# Patient Record
Sex: Female | Born: 1955 | Race: Black or African American | Hispanic: No | Smoking: Never smoker
Health system: Southern US, Community
[De-identification: ages and names within clinical notes are randomized; demographics above are authoritative.]

---

## 1999-12-11 ENCOUNTER — Emergency Department (HOSPITAL_COMMUNITY): Admission: EM | Admit: 1999-12-11 | Discharge: 1999-12-11 | Payer: Self-pay | Admitting: Emergency Medicine

## 2000-10-05 ENCOUNTER — Emergency Department (HOSPITAL_COMMUNITY): Admission: EM | Admit: 2000-10-05 | Discharge: 2000-10-05 | Payer: Self-pay | Admitting: Emergency Medicine

## 2000-10-08 ENCOUNTER — Emergency Department (HOSPITAL_COMMUNITY): Admission: EM | Admit: 2000-10-08 | Discharge: 2000-10-08 | Payer: Self-pay | Admitting: Emergency Medicine

## 2002-09-04 ENCOUNTER — Ambulatory Visit (HOSPITAL_COMMUNITY): Admission: RE | Admit: 2002-09-04 | Discharge: 2002-09-04 | Payer: Self-pay | Admitting: Obstetrics

## 2002-09-04 ENCOUNTER — Encounter: Payer: Self-pay | Admitting: Obstetrics

## 2002-10-15 ENCOUNTER — Inpatient Hospital Stay (HOSPITAL_COMMUNITY): Admission: AD | Admit: 2002-10-15 | Discharge: 2002-10-18 | Payer: Self-pay | Admitting: Obstetrics

## 2004-04-11 ENCOUNTER — Emergency Department (HOSPITAL_COMMUNITY): Admission: EM | Admit: 2004-04-11 | Discharge: 2004-04-11 | Payer: Self-pay | Admitting: Family Medicine

## 2004-04-12 ENCOUNTER — Emergency Department (HOSPITAL_COMMUNITY): Admission: EM | Admit: 2004-04-12 | Discharge: 2004-04-12 | Payer: Self-pay | Admitting: Emergency Medicine

## 2004-04-18 ENCOUNTER — Emergency Department (HOSPITAL_COMMUNITY): Admission: EM | Admit: 2004-04-18 | Discharge: 2004-04-18 | Payer: Self-pay | Admitting: Family Medicine

## 2004-04-20 ENCOUNTER — Emergency Department (HOSPITAL_COMMUNITY): Admission: EM | Admit: 2004-04-20 | Discharge: 2004-04-20 | Payer: Self-pay | Admitting: Emergency Medicine

## 2004-07-26 ENCOUNTER — Emergency Department: Payer: Self-pay | Admitting: General Practice

## 2004-11-12 ENCOUNTER — Emergency Department: Payer: Self-pay | Admitting: Emergency Medicine

## 2004-11-17 ENCOUNTER — Emergency Department: Payer: Self-pay | Admitting: Emergency Medicine

## 2004-11-17 ENCOUNTER — Other Ambulatory Visit: Payer: Self-pay

## 2005-01-06 ENCOUNTER — Emergency Department: Payer: Self-pay | Admitting: Emergency Medicine

## 2005-03-13 ENCOUNTER — Emergency Department: Payer: Self-pay | Admitting: Emergency Medicine

## 2005-04-14 ENCOUNTER — Emergency Department: Payer: Self-pay | Admitting: Emergency Medicine

## 2005-04-19 ENCOUNTER — Emergency Department: Payer: Self-pay | Admitting: Emergency Medicine

## 2005-04-26 ENCOUNTER — Emergency Department: Payer: Self-pay | Admitting: Emergency Medicine

## 2005-04-29 ENCOUNTER — Emergency Department: Payer: Self-pay | Admitting: Emergency Medicine

## 2005-06-10 ENCOUNTER — Emergency Department: Payer: Self-pay | Admitting: Emergency Medicine

## 2005-06-14 ENCOUNTER — Emergency Department: Payer: Self-pay | Admitting: Emergency Medicine

## 2005-09-24 ENCOUNTER — Emergency Department: Payer: Self-pay | Admitting: Unknown Physician Specialty

## 2005-09-25 ENCOUNTER — Emergency Department: Payer: Self-pay | Admitting: Emergency Medicine

## 2005-10-03 ENCOUNTER — Emergency Department: Payer: Self-pay | Admitting: Emergency Medicine

## 2005-10-12 ENCOUNTER — Emergency Department (HOSPITAL_COMMUNITY): Admission: EM | Admit: 2005-10-12 | Discharge: 2005-10-12 | Payer: Self-pay | Admitting: Emergency Medicine

## 2005-10-31 ENCOUNTER — Emergency Department: Payer: Self-pay | Admitting: Emergency Medicine

## 2005-12-06 ENCOUNTER — Emergency Department: Payer: Self-pay | Admitting: General Practice

## 2006-02-26 ENCOUNTER — Emergency Department: Payer: Self-pay | Admitting: Unknown Physician Specialty

## 2006-03-02 ENCOUNTER — Emergency Department: Payer: Self-pay | Admitting: Emergency Medicine

## 2006-04-07 ENCOUNTER — Inpatient Hospital Stay: Payer: Self-pay

## 2006-04-08 ENCOUNTER — Other Ambulatory Visit: Payer: Self-pay

## 2006-05-04 ENCOUNTER — Emergency Department: Payer: Self-pay | Admitting: Internal Medicine

## 2006-05-09 ENCOUNTER — Inpatient Hospital Stay: Payer: Self-pay

## 2006-05-27 ENCOUNTER — Other Ambulatory Visit: Payer: Self-pay

## 2006-05-27 ENCOUNTER — Emergency Department: Payer: Self-pay | Admitting: Emergency Medicine

## 2006-07-12 ENCOUNTER — Emergency Department: Payer: Self-pay | Admitting: Emergency Medicine

## 2006-08-12 ENCOUNTER — Emergency Department: Payer: Self-pay

## 2007-04-21 ENCOUNTER — Emergency Department: Payer: Self-pay | Admitting: Emergency Medicine

## 2007-10-31 ENCOUNTER — Emergency Department: Payer: Self-pay | Admitting: Emergency Medicine

## 2007-12-11 ENCOUNTER — Emergency Department: Payer: Self-pay | Admitting: Emergency Medicine

## 2007-12-23 ENCOUNTER — Emergency Department: Payer: Self-pay | Admitting: Emergency Medicine

## 2008-03-08 ENCOUNTER — Emergency Department: Payer: Self-pay | Admitting: Emergency Medicine

## 2008-05-01 ENCOUNTER — Emergency Department: Payer: Self-pay | Admitting: Unknown Physician Specialty

## 2008-05-02 IMAGING — CR DG ABDOMEN ACUTE W/ 1V CHEST
3 series · 3 of 3 positions shown · non-contrast
Comparison: none

CLINICAL DATA: Abdominal pain, left-sided.
 ACUTE ABDOMINAL SERIES:

[view not recorded (1 of 3)]
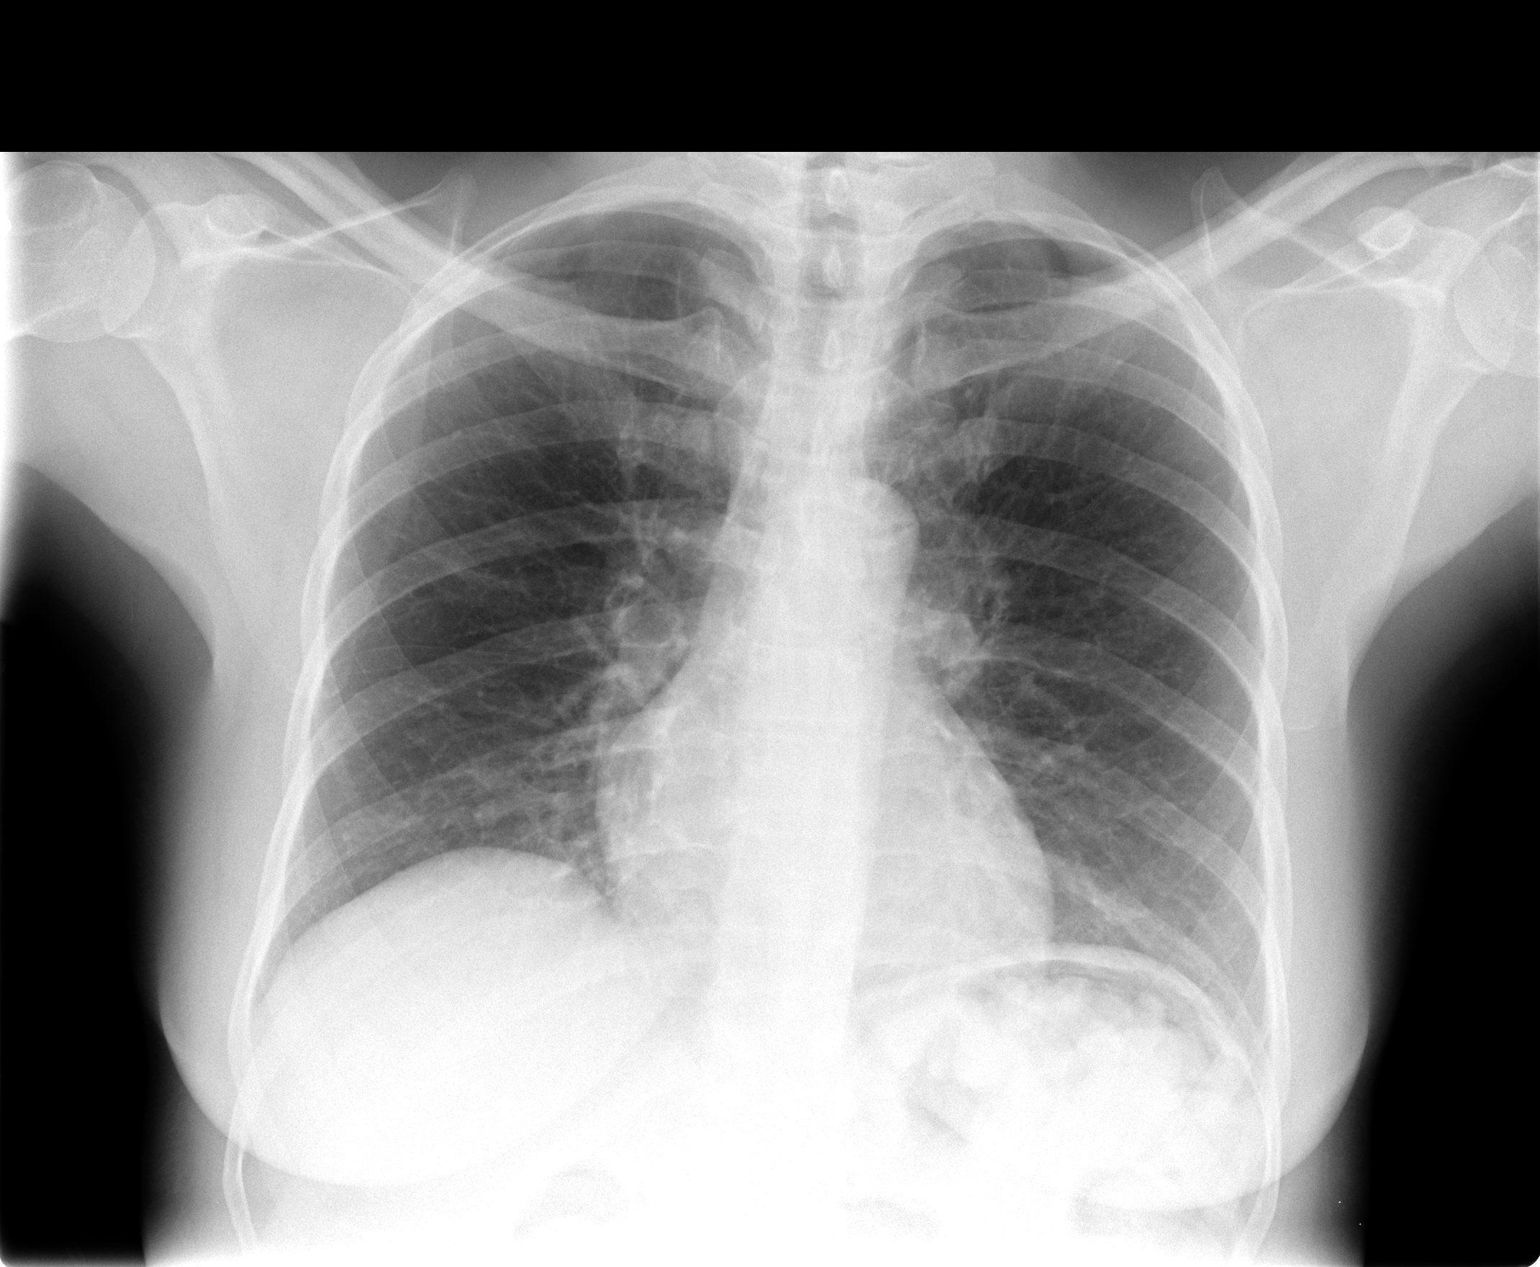

[view not recorded (2 of 3)]
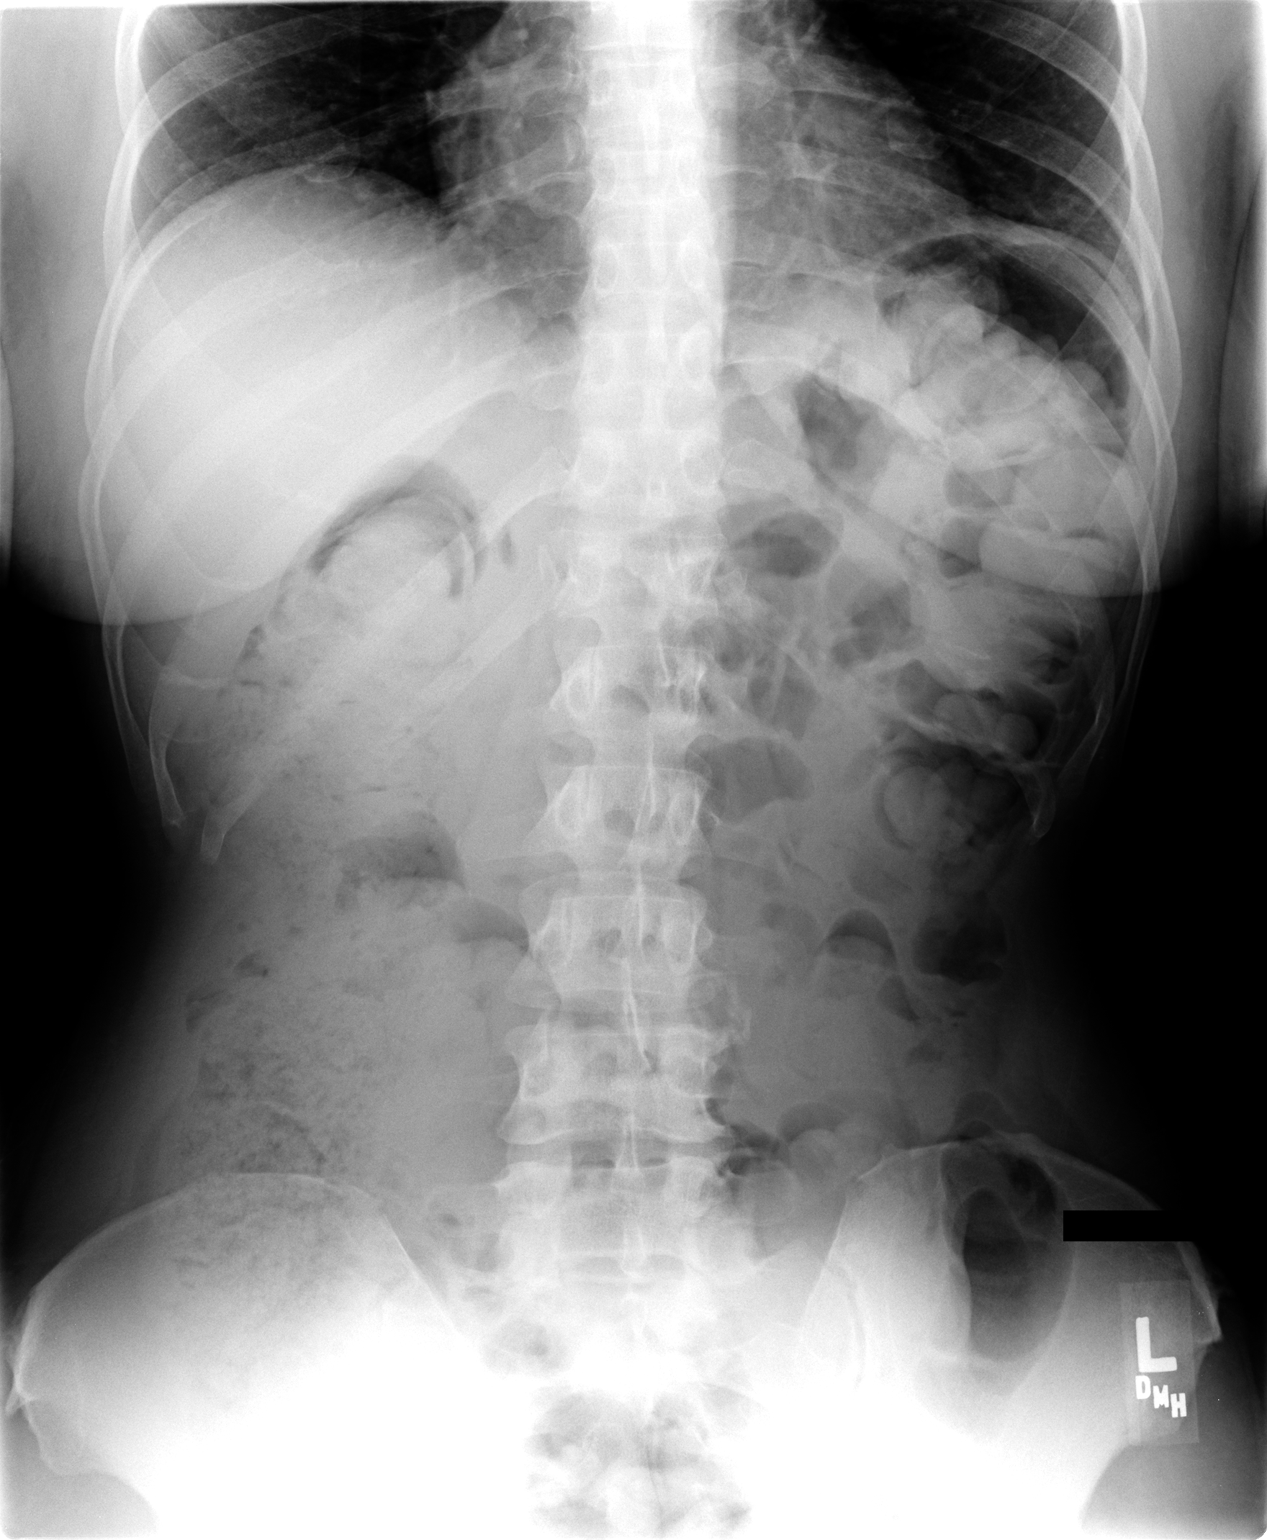

[view not recorded (3 of 3)]
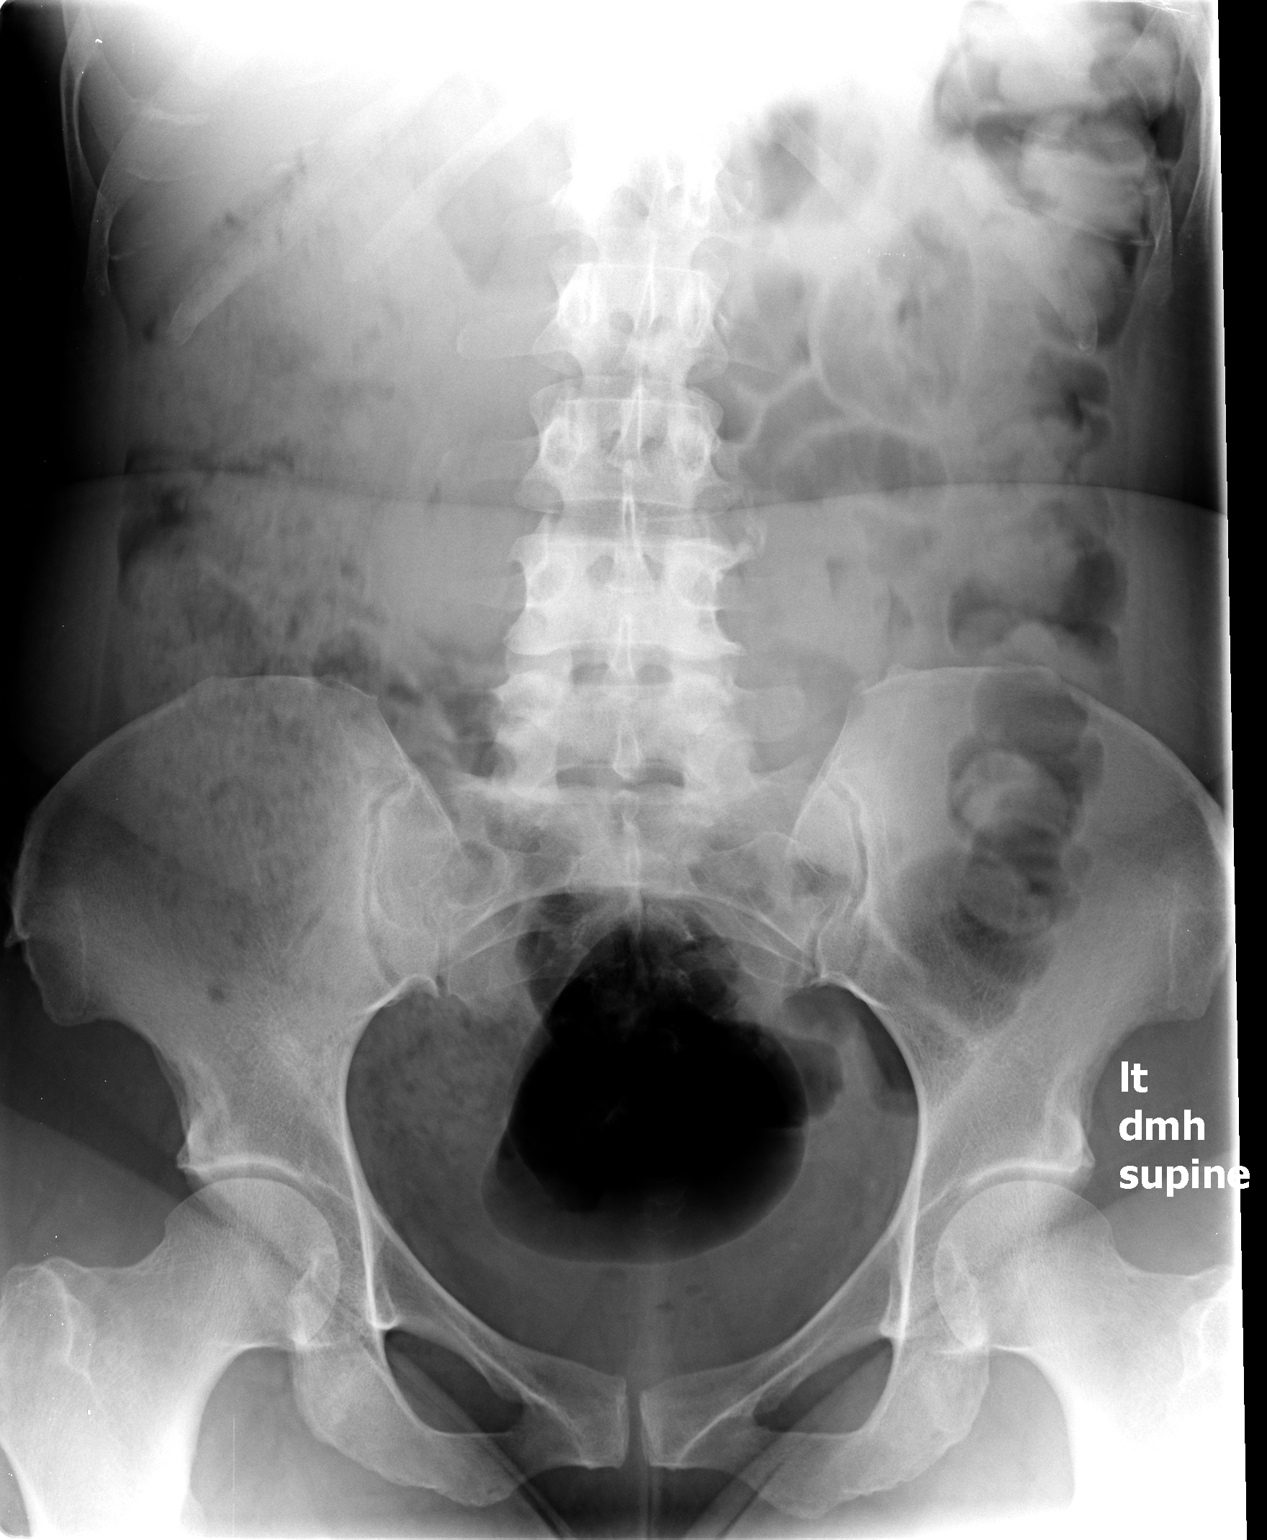

[3 of 3 positions shown; findings below may reference images not displayed]

FINDINGS: Chest x-ray shows no acute abnormality.  The heart size is normal.  There may be some mild chronic lung disease with increased markings bilaterally. 
 The patient is constipated with retained stool especially in the right colon, but also in the left colon.  There is no obstruction or free air.  No acute bony abnormality.
IMPRESSION: Constipation without obstruction.

## 2008-05-02 IMAGING — CT CT HEAD W/O CM
2 of 3 series · 15 of 30 positions shown, 17 images · IV contrast (agent unspecified)
Comparison: None.

CLINICAL DATA: Headache. 
 HEAD CT WITHOUT CONTRAST:
TECHNIQUE: Contiguous axial images were obtained from the base of the skull through the vertex according to standard protocol without contrast.

[Series 2: head-trauma 4.8 h45s st · axial · 0.49mm/px · z∈[-125,+3]mm · 7 of 36 slices shown, 9 images]
[im 5/36  brain]
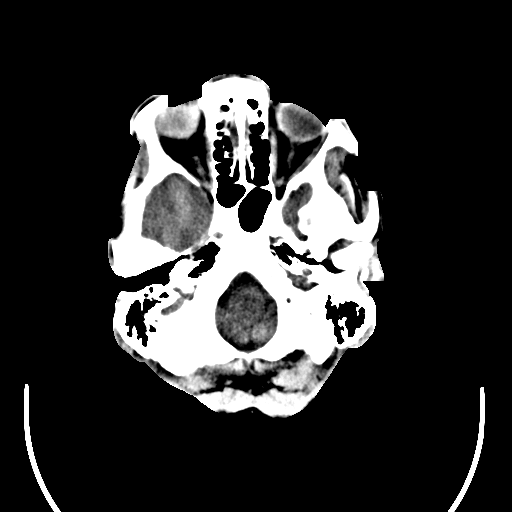
[im 5/36  bone]
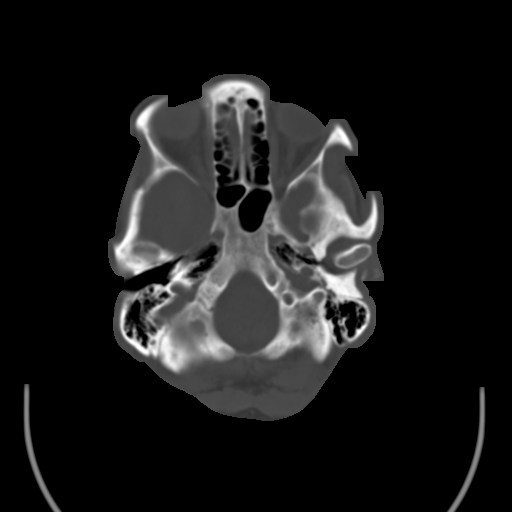
[im 9/36  brain]
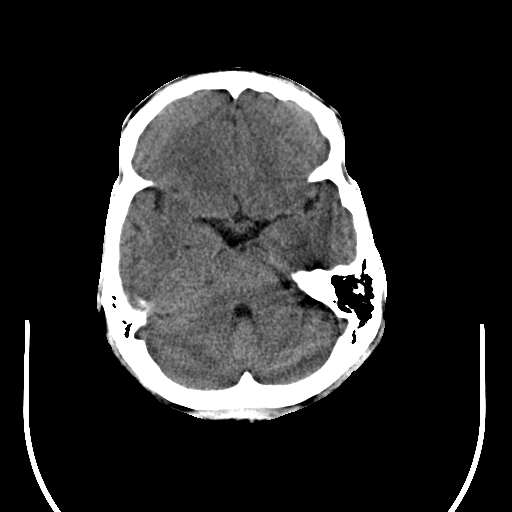
[im 14/36  brain]
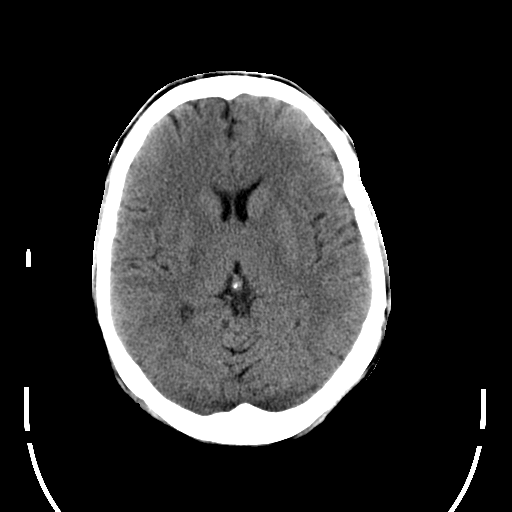
[im 18/36  brain]
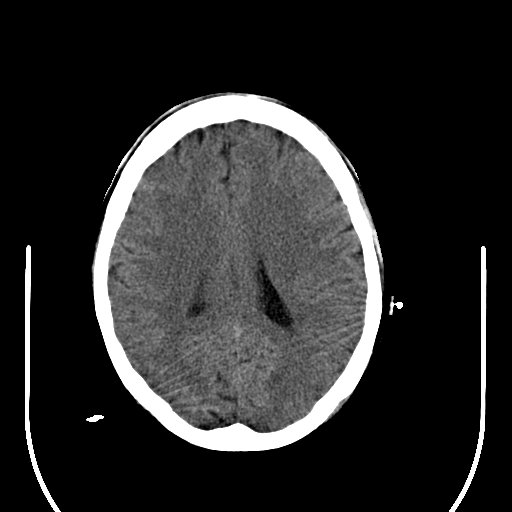
[im 22/36  brain]
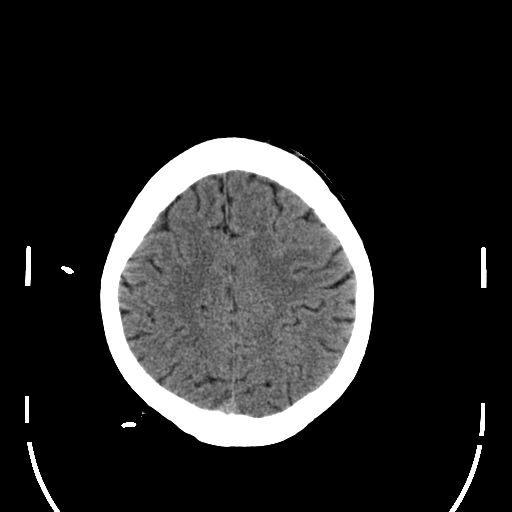
[im 22/36  bone]
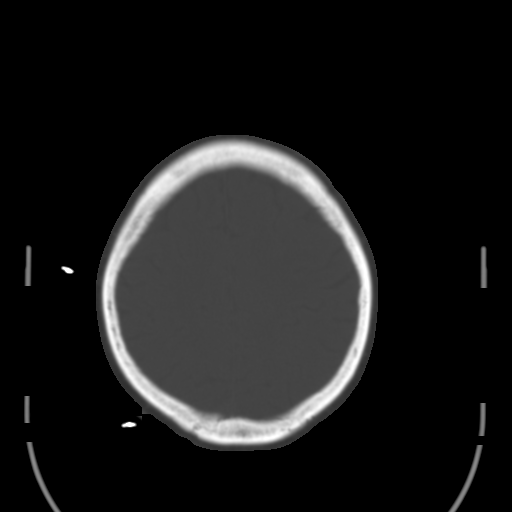
[im 27/36  brain]
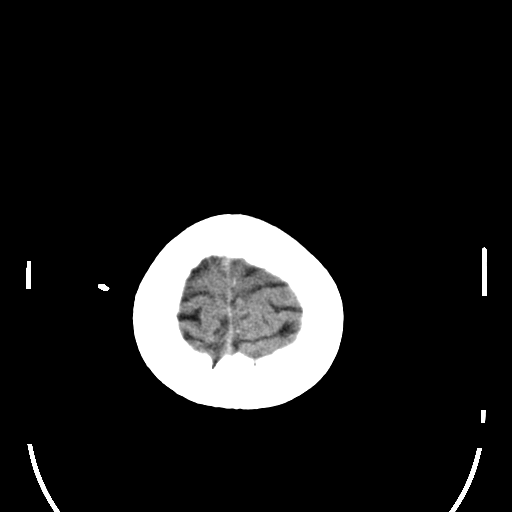
[im 31/36  brain]
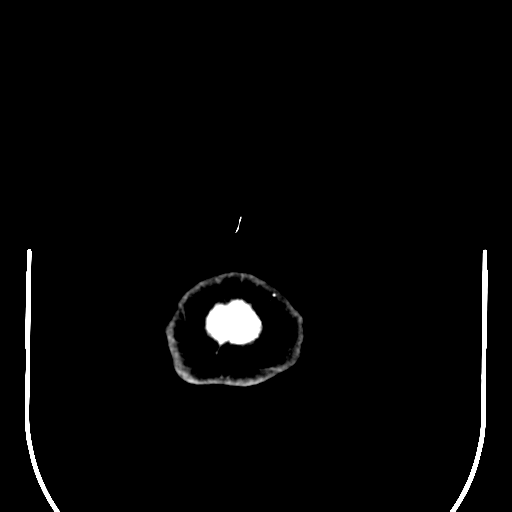

[Series 4: head-trauma 2.4 h60s bone · axial · 0.49mm/px · z∈[-126,+16]mm · 8 of 72 slices shown]
[im 9/72  bone]
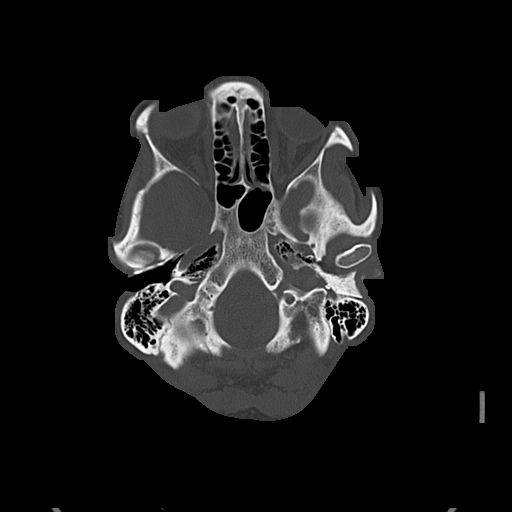
[im 17/72  bone]
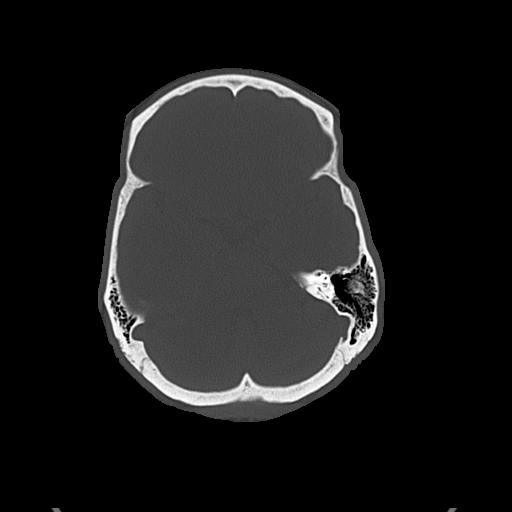
[im 26/72  bone]
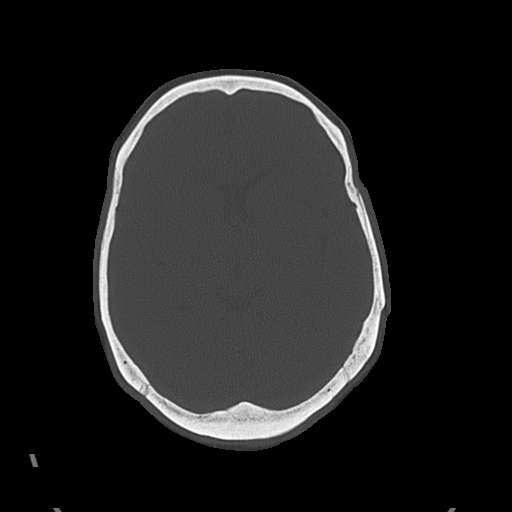
[im 34/72  bone]
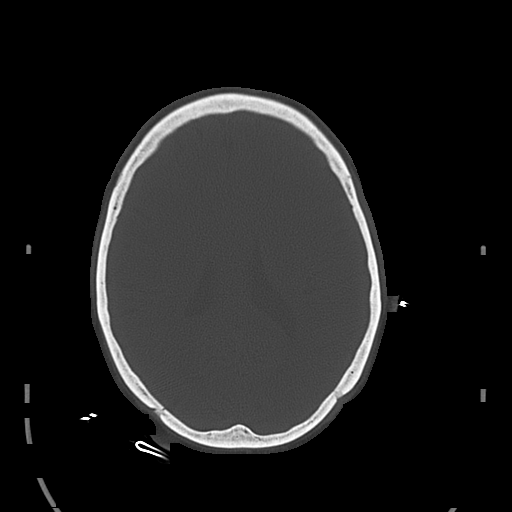
[im 42/72  bone]
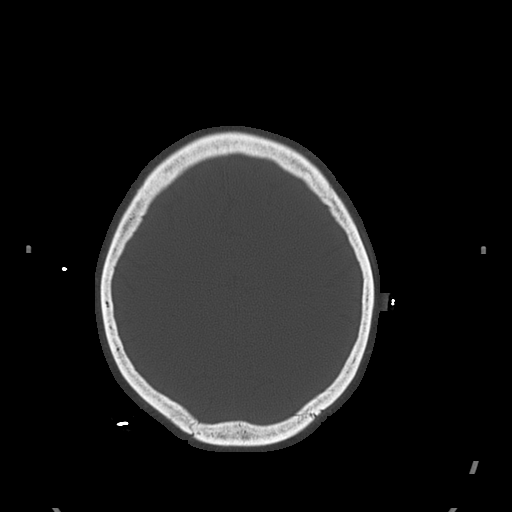
[im 51/72  bone]
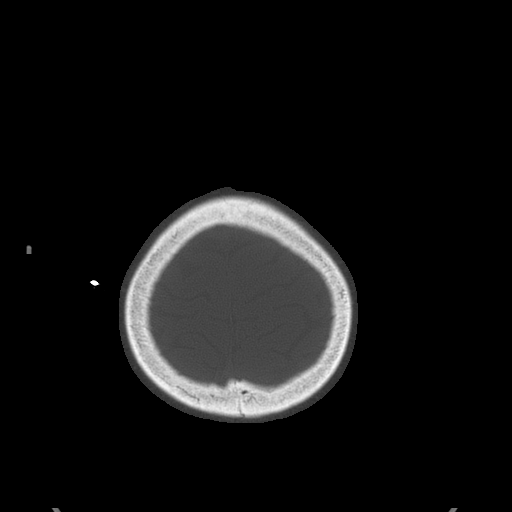
[im 59/72  bone]
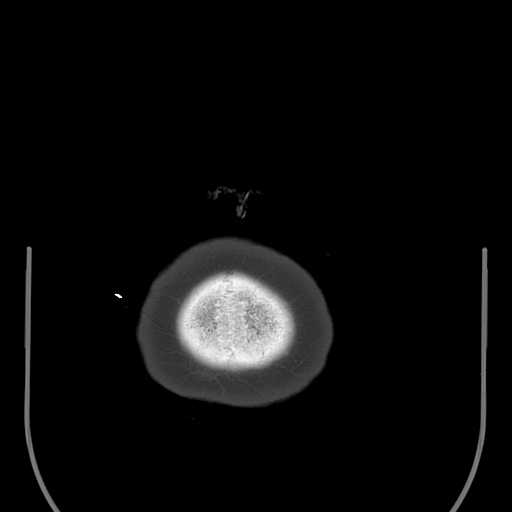
[im 67/72  bone]
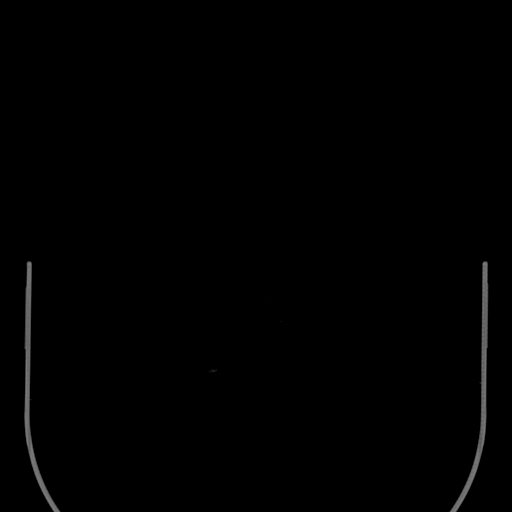

[15 of 30 positions shown; findings below may reference images not displayed]

FINDINGS: There is no evidence of intracranial hemorrhage, brain edema, acute infarct, mass lesion, or mass effect.  No other intra-axial abnormalities are seen, and the ventricles are within normal limits.  No abnormal extra-axial fluid collections or masses are identified.  No skull abnormalities are noted.
IMPRESSION: Negative non-contrast head CT.

## 2008-05-23 ENCOUNTER — Emergency Department: Payer: Self-pay | Admitting: Emergency Medicine

## 2008-06-28 ENCOUNTER — Inpatient Hospital Stay: Payer: Self-pay | Admitting: Specialist

## 2008-07-29 ENCOUNTER — Inpatient Hospital Stay: Payer: Self-pay | Admitting: Psychiatry

## 2008-08-09 ENCOUNTER — Emergency Department: Payer: Self-pay | Admitting: Emergency Medicine

## 2008-08-23 ENCOUNTER — Emergency Department: Payer: Self-pay | Admitting: Emergency Medicine

## 2008-12-13 ENCOUNTER — Ambulatory Visit: Payer: Self-pay | Admitting: Gastroenterology

## 2014-06-22 ENCOUNTER — Other Ambulatory Visit: Payer: Self-pay | Admitting: Internal Medicine

## 2014-06-22 DIAGNOSIS — Z1231 Encounter for screening mammogram for malignant neoplasm of breast: Secondary | ICD-10-CM

## 2014-07-01 ENCOUNTER — Ambulatory Visit
Admission: RE | Admit: 2014-07-01 | Discharge: 2014-07-01 | Disposition: A | Discharge status: Home / Self Care | Payer: Medicare Other | Source: Ambulatory Visit | Attending: Internal Medicine | Admitting: Internal Medicine

## 2014-07-01 DIAGNOSIS — Z1231 Encounter for screening mammogram for malignant neoplasm of breast: Secondary | ICD-10-CM | POA: Insufficient documentation

## 2014-07-02 ENCOUNTER — Other Ambulatory Visit: Payer: Self-pay | Admitting: Internal Medicine

## 2014-07-02 DIAGNOSIS — R928 Other abnormal and inconclusive findings on diagnostic imaging of breast: Secondary | ICD-10-CM

## 2014-07-12 ENCOUNTER — Ambulatory Visit
Admission: RE | Admit: 2014-07-12 | Discharge: 2014-07-12 | Disposition: A | Discharge status: Home / Self Care | Payer: Medicare Other | Source: Ambulatory Visit | Attending: Internal Medicine | Admitting: Internal Medicine

## 2014-07-12 DIAGNOSIS — R928 Other abnormal and inconclusive findings on diagnostic imaging of breast: Secondary | ICD-10-CM

## 2014-07-12 DIAGNOSIS — N6002 Solitary cyst of left breast: Secondary | ICD-10-CM | POA: Diagnosis not present

## 2014-07-12 DIAGNOSIS — N6489 Other specified disorders of breast: Secondary | ICD-10-CM | POA: Diagnosis present

## 2015-11-10 ENCOUNTER — Other Ambulatory Visit: Payer: Self-pay | Admitting: Family Medicine

## 2015-11-10 DIAGNOSIS — Z1231 Encounter for screening mammogram for malignant neoplasm of breast: Secondary | ICD-10-CM

## 2015-12-19 ENCOUNTER — Ambulatory Visit: Payer: Medicare Other | Attending: Family Medicine

## 2016-01-12 ENCOUNTER — Ambulatory Visit
Admission: RE | Admit: 2016-01-12 | Discharge: 2016-01-12 | Disposition: A | Discharge status: Home / Self Care | Payer: Medicare Other | Source: Ambulatory Visit | Attending: Family Medicine | Admitting: Family Medicine

## 2016-01-12 DIAGNOSIS — Z1231 Encounter for screening mammogram for malignant neoplasm of breast: Secondary | ICD-10-CM

## 2016-01-13 ENCOUNTER — Ambulatory Visit: Payer: Medicare Other

## 2017-10-10 ENCOUNTER — Other Ambulatory Visit: Payer: Self-pay | Admitting: Family Medicine

## 2017-10-10 DIAGNOSIS — Z1231 Encounter for screening mammogram for malignant neoplasm of breast: Secondary | ICD-10-CM

## 2017-10-18 ENCOUNTER — Other Ambulatory Visit: Payer: Self-pay | Admitting: Family Medicine

## 2017-10-18 DIAGNOSIS — N644 Mastodynia: Secondary | ICD-10-CM

## 2017-10-29 ENCOUNTER — Ambulatory Visit
Admission: RE | Admit: 2017-10-29 | Discharge: 2017-10-29 | Disposition: A | Discharge status: Home / Self Care | Payer: Medicare Other | Source: Ambulatory Visit | Attending: Family Medicine | Admitting: Family Medicine

## 2017-10-29 ENCOUNTER — Ambulatory Visit: Payer: Medicare Other

## 2017-10-29 ENCOUNTER — Ambulatory Visit (INDEPENDENT_AMBULATORY_CARE_PROVIDER_SITE_OTHER): Payer: Medicare Other | Admitting: Physician Assistant

## 2017-10-29 DIAGNOSIS — N644 Mastodynia: Secondary | ICD-10-CM

## 2017-11-28 ENCOUNTER — Ambulatory Visit (INDEPENDENT_AMBULATORY_CARE_PROVIDER_SITE_OTHER): Payer: Medicare Other | Admitting: Physician Assistant

## 2019-04-13 ENCOUNTER — Other Ambulatory Visit: Payer: Self-pay | Admitting: Family Medicine

## 2019-04-13 DIAGNOSIS — Z780 Asymptomatic menopausal state: Secondary | ICD-10-CM

## 2019-04-13 DIAGNOSIS — R5381 Other malaise: Secondary | ICD-10-CM

## 2019-04-28 ENCOUNTER — Other Ambulatory Visit: Payer: Self-pay | Admitting: Family Medicine

## 2019-04-28 DIAGNOSIS — E2839 Other primary ovarian failure: Secondary | ICD-10-CM

## 2019-08-21 ENCOUNTER — Other Ambulatory Visit: Payer: Medicare Other

## 2019-12-07 ENCOUNTER — Other Ambulatory Visit (HOSPITAL_COMMUNITY): Payer: Self-pay | Admitting: Student

## 2019-12-07 DIAGNOSIS — N183 Chronic kidney disease, stage 3 unspecified: Secondary | ICD-10-CM

## 2019-12-14 ENCOUNTER — Encounter (HOSPITAL_COMMUNITY): Payer: Self-pay | Admitting: Student

## 2019-12-28 ENCOUNTER — Encounter (HOSPITAL_COMMUNITY): Payer: Self-pay | Admitting: Student

## 2019-12-29 ENCOUNTER — Other Ambulatory Visit: Payer: Self-pay | Admitting: Student

## 2019-12-29 DIAGNOSIS — Z1231 Encounter for screening mammogram for malignant neoplasm of breast: Secondary | ICD-10-CM

## 2020-01-07 ENCOUNTER — Telehealth (HOSPITAL_COMMUNITY): Payer: Self-pay | Admitting: Student

## 2020-01-07 NOTE — Telephone Encounter (Signed)
Just an FYI. We have made several attempts to contact this patient including sending a letter to schedule or reschedule their echocardiogram. We will be removing the patient from the echo WQ.  12/28/19 MAILED LETTER [facility]  12/24/19 NVM set up and unable to lm @ 10:38/[facility] 12/14/19 Called and went straight to VM and not set up - MAILED LETTER/[facility] 12/07/19 called and NVM set up @ 3:09/[facility]     Thank you

## 2020-07-05 ENCOUNTER — Emergency Department (HOSPITAL_COMMUNITY)
Admission: EM | Admit: 2020-07-05 | Discharge: 2020-07-05 | Disposition: A | Discharge status: Home / Self Care | Payer: Medicare Other | Attending: Emergency Medicine | Admitting: Emergency Medicine

## 2020-07-05 ENCOUNTER — Emergency Department (HOSPITAL_COMMUNITY): Payer: Medicare Other

## 2020-07-05 ENCOUNTER — Encounter (HOSPITAL_COMMUNITY): Payer: Self-pay

## 2020-07-05 ENCOUNTER — Other Ambulatory Visit: Payer: Self-pay

## 2020-07-05 DIAGNOSIS — R519 Headache, unspecified: Secondary | ICD-10-CM | POA: Insufficient documentation

## 2020-07-05 DIAGNOSIS — I10 Essential (primary) hypertension: Secondary | ICD-10-CM | POA: Diagnosis not present

## 2020-07-05 DIAGNOSIS — H9201 Otalgia, right ear: Secondary | ICD-10-CM | POA: Insufficient documentation

## 2020-07-05 LAB — COMPREHENSIVE METABOLIC PANEL
ALT: 19 U/L (ref 0–44)
AST: 29 U/L (ref 15–41)
Albumin: 3.5 g/dL (ref 3.5–5.0)
Alkaline Phosphatase: 63 U/L (ref 38–126)
Anion gap: 6 (ref 5–15)
BUN: 18 mg/dL (ref 8–23)
CO2: 28 mmol/L (ref 22–32)
Calcium: 10.2 mg/dL (ref 8.9–10.3)
Chloride: 102 mmol/L (ref 98–111)
Creatinine, Ser: 1.16 mg/dL — ABNORMAL HIGH (ref 0.44–1.00)
GFR, Estimated: 52 mL/min — ABNORMAL LOW (ref >60)
Glucose, Bld: 104 mg/dL — ABNORMAL HIGH (ref 70–99)
Potassium: 4 mmol/L (ref 3.5–5.1)
Sodium: 136 mmol/L (ref 135–145)
Total Bilirubin: 0.5 mg/dL (ref 0.3–1.2)
Total Protein: 7.1 g/dL (ref 6.5–8.1)

## 2020-07-05 LAB — RAPID URINE DRUG SCREEN, HOSP PERFORMED
Amphetamines: NOT DETECTED
Barbiturates: NOT DETECTED
Benzodiazepines: NOT DETECTED
Cocaine: NOT DETECTED
Opiates: NOT DETECTED
Tetrahydrocannabinol: NOT DETECTED

## 2020-07-05 LAB — CBC
HCT: 38.6 % (ref 36.0–46.0)
Hemoglobin: 12.1 g/dL (ref 12.0–15.0)
MCH: 27.3 pg (ref 26.0–34.0)
MCHC: 31.3 g/dL (ref 30.0–36.0)
MCV: 86.9 fL (ref 80.0–100.0)
Platelets: 194 10*3/uL (ref 150–400)
RBC: 4.44 MIL/uL (ref 3.87–5.11)
RDW: 14.7 % (ref 11.5–15.5)
WBC: 8.5 10*3/uL (ref 4.0–10.5)
nRBC: 0 % (ref 0.0–0.2)

## 2020-07-05 LAB — SALICYLATE LEVEL: Salicylate Lvl: <7 mg/dL — ABNORMAL LOW (ref 7.0–30.0)

## 2020-07-05 LAB — ACETAMINOPHEN LEVEL: Acetaminophen (Tylenol), Serum: <10 ug/mL — ABNORMAL LOW (ref 10–30)

## 2020-07-05 LAB — ETHANOL: Alcohol, Ethyl (B): <10 mg/dL (ref <10)

## 2020-07-05 MED ORDER — ACETAMINOPHEN 500 MG PO TABS
1000.0000 mg | ORAL_TABLET | Freq: Once | ORAL | Status: AC
  Administered 2020-07-05: 1000 mg via ORAL
  Filled 2020-07-05: qty 2

## 2020-07-05 MED ORDER — PALIPERIDONE ER 6 MG PO TB24
6.0000 mg | ORAL_TABLET | Freq: Every day | ORAL | Status: DC
  Administered 2020-07-05: 6 mg via ORAL
  Filled 2020-07-05 (×2): qty 1

## 2020-07-05 MED ORDER — AMLODIPINE BESYLATE 5 MG PO TABS
2.5000 mg | ORAL_TABLET | Freq: Every day | ORAL | Status: DC
  Administered 2020-07-05: 2.5 mg via ORAL
  Filled 2020-07-05: qty 1
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
## 2020-07-05 NOTE — Discharge Instructions (Addendum)
Please read the attached information about resources for counseling/therapy in the area.  I believe this to be helpful.  Please follow-up with your primary care provider this week.  You may always return to the ER for reevaluation for any new or concerning symptoms.  Please take your blood pressure medication, your Invega/paliperidone as prescribed every day.
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
## 2020-07-26 ENCOUNTER — Emergency Department (HOSPITAL_COMMUNITY)
Admission: EM | Admit: 2020-07-26 | Discharge: 2020-07-27 | Disposition: A | Discharge status: Other Acute Inpatient Hospital | Payer: Medicare Other | Attending: Emergency Medicine | Admitting: Emergency Medicine

## 2020-07-26 ENCOUNTER — Other Ambulatory Visit: Payer: Self-pay

## 2020-07-26 DIAGNOSIS — F23 Brief psychotic disorder: Secondary | ICD-10-CM | POA: Diagnosis not present

## 2020-07-26 DIAGNOSIS — Z79899 Other long term (current) drug therapy: Secondary | ICD-10-CM | POA: Diagnosis not present

## 2020-07-26 DIAGNOSIS — Y900 Blood alcohol level of less than 20 mg/100 ml: Secondary | ICD-10-CM | POA: Insufficient documentation

## 2020-07-26 DIAGNOSIS — F22 Delusional disorders: Secondary | ICD-10-CM | POA: Diagnosis present

## 2020-07-26 DIAGNOSIS — Z20822 Contact with and (suspected) exposure to covid-19: Secondary | ICD-10-CM | POA: Diagnosis not present

## 2020-07-26 LAB — COMPREHENSIVE METABOLIC PANEL
ALT: 17 U/L (ref 0–44)
AST: 22 U/L (ref 15–41)
Albumin: 3.4 g/dL — ABNORMAL LOW (ref 3.5–5.0)
Alkaline Phosphatase: 73 U/L (ref 38–126)
Anion gap: 8 (ref 5–15)
BUN: 16 mg/dL (ref 8–23)
CO2: 23 mmol/L (ref 22–32)
Calcium: 9.8 mg/dL (ref 8.9–10.3)
Chloride: 106 mmol/L (ref 98–111)
Creatinine, Ser: 1.14 mg/dL — ABNORMAL HIGH (ref 0.44–1.00)
GFR, Estimated: 53 mL/min — ABNORMAL LOW (ref >60)
Glucose, Bld: 94 mg/dL (ref 70–99)
Potassium: 4.1 mmol/L (ref 3.5–5.1)
Sodium: 137 mmol/L (ref 135–145)
Total Bilirubin: 0.9 mg/dL (ref 0.3–1.2)
Total Protein: 7 g/dL (ref 6.5–8.1)

## 2020-07-26 LAB — RESP PANEL BY RT-PCR (FLU A&B, COVID) ARPGX2
Influenza A by PCR: NEGATIVE
Influenza B by PCR: NEGATIVE
SARS Coronavirus 2 by RT PCR: NEGATIVE

## 2020-07-26 LAB — CBC WITH DIFFERENTIAL/PLATELET
Abs Immature Granulocytes: 0.01 10*3/uL (ref 0.00–0.07)
Basophils Absolute: 0 10*3/uL (ref 0.0–0.1)
Basophils Relative: 0 %
Eosinophils Absolute: 0.3 10*3/uL (ref 0.0–0.5)
Eosinophils Relative: 4 %
HCT: 40 % (ref 36.0–46.0)
Hemoglobin: 12.8 g/dL (ref 12.0–15.0)
Immature Granulocytes: 0 %
Lymphocytes Relative: 38 %
Lymphs Abs: 3.4 10*3/uL (ref 0.7–4.0)
MCH: 27.4 pg (ref 26.0–34.0)
MCHC: 32 g/dL (ref 30.0–36.0)
MCV: 85.7 fL (ref 80.0–100.0)
Monocytes Absolute: 0.4 10*3/uL (ref 0.1–1.0)
Monocytes Relative: 5 %
Neutro Abs: 4.8 10*3/uL (ref 1.7–7.7)
Neutrophils Relative %: 53 %
Platelets: 185 10*3/uL (ref 150–400)
RBC: 4.67 MIL/uL (ref 3.87–5.11)
RDW: 14.7 % (ref 11.5–15.5)
WBC: 9 10*3/uL (ref 4.0–10.5)
nRBC: 0 % (ref 0.0–0.2)

## 2020-07-26 LAB — RAPID URINE DRUG SCREEN, HOSP PERFORMED
Amphetamines: NOT DETECTED
Barbiturates: NOT DETECTED
Benzodiazepines: NOT DETECTED
Cocaine: NOT DETECTED
Opiates: NOT DETECTED
Tetrahydrocannabinol: NOT DETECTED

## 2020-07-26 LAB — ACETAMINOPHEN LEVEL: Acetaminophen (Tylenol), Serum: <10 ug/mL — ABNORMAL LOW (ref 10–30)

## 2020-07-26 LAB — SALICYLATE LEVEL: Salicylate Lvl: <7 mg/dL — ABNORMAL LOW (ref 7.0–30.0)

## 2020-07-26 LAB — ETHANOL: Alcohol, Ethyl (B): <10 mg/dL (ref <10)

## 2020-07-26 MED ORDER — NORTRIPTYLINE HCL 25 MG PO CAPS
25.0000 mg | ORAL_CAPSULE | Freq: Every day | ORAL | Status: DC
  Administered 2020-07-26: 25 mg via ORAL
  Filled 2020-07-26: qty 1
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
## 2020-07-26 NOTE — ED Notes (Signed)
Pt talking to TTS 
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
## 2020-07-26 NOTE — ED Notes (Signed)
TTS crisis counselor spoke with this RN on the phone and stated that pt is recommended for inpatient. Counselor also told this RN that she plans to call [facility] in regards to this pt after ending our phone call.

## 2020-07-27 NOTE — ED Notes (Signed)
Pt speaking incoherently alone in room, appearing to believe she is speaking with another person.

## 2020-07-27 NOTE — BH Assessment (Addendum)
Disposition: Per Melbourne Abts, PA pt meets inpatient criteria. RN informed of disposition.  [facility] AC notified of disposition and no appropriate bed available at [facility].  CSW to fax out.  Patient has been faxed out to multiple facilities for consideration of be placement. @2330 . Pending review a the following facilities:   [facility]  Pending - Request Sent N/A [address] [place] [phone] --   [facility]  Pending - Request Sent N/A [address] [facility] [place] [phone] --   [facility]  Pending - Request Sent N/A [address] [place] [phone] --   [facility]  Pending - Request Sent N/A [address] [place] [phone] --   [facility]  Pending - Request Sent N/A [address] Heidi [place] [address] [phone] --   [facility]  Pending - Request Sent N/A [address] Rande [place] [phone] --   [facility]  Pending - Request Sent N/A [address] [place] [phone] --   [facility]  Pending - Request Sent N/A [address] [place] [address] [place] [address] [phone] --   [facility]  Pending - Request Sent N/A [address] [facility] [place] [phone] --   [facility]  Pending - Request Sent N/A [address] [place] [phone] --   [facility]  Pending - Request Sent N/A [address], [place] [phone] --   [facility]  Pending - Request Sent N/A [address] [place] [phone] --   [facility]  Pending -  Request Sent N/A [address], [place] [phone] --   [facility]  Pending - Request Sent N/A [address] [place] [phone] --   [facility]  Pending - Request Sent N/A [address], [place] [phone] --   [facility]  Pending - Request Sent N/A [address] [place] [phone] --   [facility]  Pending - Request Sent N/A [address], [place] [phone] --   [facility]  Pending - Request Sent N/A [address], [place] [phone] --   [facility]  Pending - Request Sent N/A [address] [place] [address] [phone] --   [facility]  Pending - Request Sent N/A [address] [place], [place] [phone] --   [facility]  Pending - Request Sent N/A [address] [place] [phone] --   [facility]  Pending - Request Sent N/A [address] [place] [phone] --   [facility]  Pending - Request Sent N/A [address] [place] [address] [phone] --   [facility]  Pending - Request Sent N/A [address] [place], [place] [phone] --   [facility]  Pending - Request Sent N/A [address] [place] [phone] --   [facility]  Pending - Request Sent N/A [address] [place], [place] [phone]  [phone] --   [facility]  Pending - Request Sent N/A [phone] [address] [place]  [address] [phone] --

## 2020-07-27 NOTE — [facility] Notes (Signed)
CSW spoke with ED RN regarding transportation needs. Per the RN, the Pt has been accepted to a facility that is 180 miles away and [facility] only transports patients within a 75 miles distance. CSW contacted the ED SW to assist with securing appropriate transportation.    Damita Dunnings, MSW, LCSW-A  9:12 AM 07/27/2020

## 2020-07-27 NOTE — Progress Notes (Signed)
Pt accepted to [facility]    Patient meets inpatient criteria per Melbourne Abts, PA   Dr. Kevan Ny is the attending provider.    Call report to [phone]   Edwina Barth, RN @ [facility] ED notified.     Pt scheduled  to arrive at [facility] today by 1300.    Damita Dunnings, MSW, LCSW-A  8:44 AM 07/27/2020

## 2020-09-29 ENCOUNTER — Emergency Department (HOSPITAL_COMMUNITY)
Admission: EM | Admit: 2020-09-29 | Discharge: 2020-09-29 | Disposition: A | Discharge status: Home / Self Care | Payer: Medicare Other | Attending: Emergency Medicine | Admitting: Emergency Medicine

## 2020-09-29 ENCOUNTER — Emergency Department (HOSPITAL_COMMUNITY): Payer: Medicare Other

## 2020-09-29 ENCOUNTER — Other Ambulatory Visit: Payer: Self-pay

## 2020-09-29 ENCOUNTER — Encounter (HOSPITAL_COMMUNITY): Payer: Self-pay | Admitting: *Deleted

## 2020-09-29 DIAGNOSIS — R519 Headache, unspecified: Secondary | ICD-10-CM | POA: Diagnosis not present

## 2020-09-29 DIAGNOSIS — M25552 Pain in left hip: Secondary | ICD-10-CM | POA: Diagnosis not present

## 2020-09-29 DIAGNOSIS — W228XXA Striking against or struck by other objects, initial encounter: Secondary | ICD-10-CM | POA: Diagnosis not present

## 2020-09-29 DIAGNOSIS — Z20822 Contact with and (suspected) exposure to covid-19: Secondary | ICD-10-CM | POA: Diagnosis not present

## 2020-09-29 LAB — RESP PANEL BY RT-PCR (FLU A&B, COVID) ARPGX2
Influenza A by PCR: NEGATIVE
Influenza B by PCR: NEGATIVE
SARS Coronavirus 2 by RT PCR: NEGATIVE

## 2020-09-29 LAB — COMPREHENSIVE METABOLIC PANEL
ALT: 18 U/L (ref 0–44)
AST: 25 U/L (ref 15–41)
Albumin: 3.6 g/dL (ref 3.5–5.0)
Alkaline Phosphatase: 73 U/L (ref 38–126)
Anion gap: 10 (ref 5–15)
BUN: 11 mg/dL (ref 8–23)
CO2: 20 mmol/L — ABNORMAL LOW (ref 22–32)
Calcium: 10.1 mg/dL (ref 8.9–10.3)
Chloride: 108 mmol/L (ref 98–111)
Creatinine, Ser: 1.07 mg/dL — ABNORMAL HIGH (ref 0.44–1.00)
GFR, Estimated: 57 mL/min — ABNORMAL LOW (ref >60)
Glucose, Bld: 102 mg/dL — ABNORMAL HIGH (ref 70–99)
Potassium: 4 mmol/L (ref 3.5–5.1)
Sodium: 138 mmol/L (ref 135–145)
Total Bilirubin: 0.6 mg/dL (ref 0.3–1.2)
Total Protein: 7.3 g/dL (ref 6.5–8.1)

## 2020-09-29 LAB — CBC WITH DIFFERENTIAL/PLATELET
Abs Immature Granulocytes: 0.02 10*3/uL (ref 0.00–0.07)
Basophils Absolute: 0 10*3/uL (ref 0.0–0.1)
Basophils Relative: 0 %
Eosinophils Absolute: 0.2 10*3/uL (ref 0.0–0.5)
Eosinophils Relative: 3 %
HCT: 44.8 % (ref 36.0–46.0)
Hemoglobin: 13.6 g/dL (ref 12.0–15.0)
Immature Granulocytes: 0 %
Lymphocytes Relative: 32 %
Lymphs Abs: 2.4 10*3/uL (ref 0.7–4.0)
MCH: 27.2 pg (ref 26.0–34.0)
MCHC: 30.4 g/dL (ref 30.0–36.0)
MCV: 89.6 fL (ref 80.0–100.0)
Monocytes Absolute: 0.5 10*3/uL (ref 0.1–1.0)
Monocytes Relative: 6 %
Neutro Abs: 4.5 10*3/uL (ref 1.7–7.7)
Neutrophils Relative %: 59 %
Platelets: 151 10*3/uL (ref 150–400)
RBC: 5 MIL/uL (ref 3.87–5.11)
RDW: 14.6 % (ref 11.5–15.5)
WBC: 7.6 10*3/uL (ref 4.0–10.5)
nRBC: 0 % (ref 0.0–0.2)

## 2020-09-29 MED ORDER — IBUPROFEN 400 MG PO TABS
600.0000 mg | ORAL_TABLET | Freq: Once | ORAL | Status: AC
  Administered 2020-09-29: 600 mg via ORAL
  Filled 2020-09-29: qty 1

## 2020-09-29 NOTE — ED Provider Notes (Signed)
Emergency Medicine Provider Triage Evaluation Note  Teresa Green , a 65 y.o. female  was evaluated in triage.  Pt complains of headache and left hip pain.  Headache is been going on for about 8 days, is bilateral and feels like a squeezing sensation.  No vision changes, slurred speech, numbness, weakness.  In the past 2 days she has also had left hip pain.  No nausea, vomiting, urinary symptoms, normal bowel movements.  No trauma or injury.  She is not taken anything for either symptom.  Review of Systems  Positive: HA, L hip pain Negative: fever  Physical Exam  BP (!) 175/78 (BP Location: Right Arm)   Pulse 70   Temp 98.2 F (36.8 C) (Oral)   Resp 18   Ht 5\' 4"  (1.626 m)   Wt 108.4 kg   SpO2 97%   BMI 41.02 kg/m  Gen:   Awake, no distress   Resp:  Normal effort  MSK:   Moves extremities without difficulty  Other:  Tenderness palpation of the left proximal leg/inguinal region.  No pain of the abdomen. No focal neurologic deficits.  CN intact.  Strength and sensation intact x4.  Medical Decision Making  Medically screening exam initiated at 5:54 AM.  Appropriate orders placed.  MAGUADALUPE LATA was informed that the remainder of the evaluation will be completed by another provider, this initial triage assessment does not replace that evaluation, and the importance of remaining in the ED until their evaluation is complete.  Labs and hip xtay and UA. pt had head CT 06/2020 for the same sxs, it was negative. Will not repeat at this time.    07/2020, PA-C 09/29/20 11/29/20    2778, MD 09/29/20 1911

## 2020-09-29 NOTE — ED Provider Notes (Signed)
[facility] EMERGENCY DEPARTMENT Provider Note   CSN: [id number] Arrival date & time: 09/29/20  0343     History Chief Complaint  Patient presents with   Headache    Teresa Green is a 65 y.o. female.   Headache Associated symptoms: no abdominal pain, no back pain, no cough, no dizziness, no ear pain, no eye pain, no fatigue, no fever, no myalgias, no nausea, no neck pain, no numbness, no seizures, no sore throat, no vomiting and no weakness   Patient presents for headache.  She states that she has had a left-sided headache that is due to voodoo dolls hitting her in the head.  Per chart review, patient has endorsed similar delusions in the past.  She also states that she has had some left hip pain that is worse with ambulation.  She states that this is from the voodoo dolls cutting into her hip.  She denies any other current symptoms.  She has not taken anything for her pain today.  Patient currently lives with her grandson.    History reviewed. No pertinent past medical history.  Patient Active Problem List   Diagnosis Date Noted   Acute psychosis (HCC)     History reviewed. No pertinent surgical history.   OB History   No obstetric history on file.     No family history on file.  Social History   Tobacco Use   Smoking status: Never   Smokeless tobacco: Never    Home Medications Prior to Admission medications   Medication Sig Start Date End Date Taking? Authorizing Provider  amLODipine (NORVASC) 2.5 MG tablet Take 2.5 mg by mouth daily. 07/23/20   [provider]  atorvastatin (LIPITOR) 20 MG tablet Take 20 mg by mouth daily. 07/23/20   [provider]  cetirizine (ZYRTEC) 10 MG tablet Take 10 mg by mouth daily as needed for allergies. 06/28/16   [provider]  fluticasone (FLONASE) 50 MCG/ACT nasal spray Place 1 spray into both nostrils daily as needed for allergies. 05/25/20   [provider]  MYRBETRIQ 25 MG TB24  tablet Take 25 mg by mouth daily. 07/23/20   [provider]  nortriptyline (PAMELOR) 50 MG capsule Take 50 mg by mouth at bedtime. 03/25/20   [provider]  telmisartan (MICARDIS) 40 MG tablet Take 40 mg by mouth daily. 07/23/20   [provider]    Allergies    Patient has no known allergies.  Review of Systems   Review of Systems  Constitutional:  Negative for activity change, appetite change, chills, fatigue and fever.  HENT:  Negative for ear pain and sore throat.   Eyes:  Negative for pain and visual disturbance.  Respiratory:  Negative for cough, chest tightness and shortness of breath.   Cardiovascular:  Negative for chest pain, palpitations and leg swelling.  Gastrointestinal:  Negative for abdominal pain, nausea and vomiting.  Genitourinary:  Negative for dysuria and hematuria.  Musculoskeletal:  Positive for arthralgias. Negative for back pain, gait problem, joint swelling, myalgias and neck pain.  Skin:  Negative for color change and rash.  Neurological:  Positive for headaches. Negative for dizziness, seizures, syncope, speech difficulty, weakness, light-headedness and numbness.  Psychiatric/Behavioral:  Negative for agitation, dysphoric mood, self-injury and suicidal ideas.        Chronic delusions  All other systems reviewed and are negative.  Physical Exam Updated Vital Signs BP (!) 173/91   Pulse 70   Temp 98.3 F (36.8  C)   Resp 14   Ht 5\' 4"  (1.626 m)   Wt 108.4 kg   SpO2 100%   BMI 41.02 kg/m   Physical Exam Vitals and nursing note reviewed.  Constitutional:      General: She is not in acute distress.    Appearance: She is well-developed. She is not ill-appearing, toxic-appearing or diaphoretic.  HENT:     Head: Normocephalic and atraumatic.  Eyes:     Conjunctiva/sclera: Conjunctivae normal.     Pupils: Pupils are equal, round, and reactive to light.  Cardiovascular:     Rate and Rhythm: Normal rate and regular rhythm.      Heart sounds: No murmur heard. Pulmonary:     Effort: Pulmonary effort is normal. No respiratory distress.     Breath sounds: Normal breath sounds. No wheezing or rales.  Abdominal:     Palpations: Abdomen is soft.     Tenderness: There is no abdominal tenderness.  Musculoskeletal:     Cervical back: Normal range of motion and neck supple.  Skin:    General: Skin is warm and dry.  Neurological:     Mental Status: She is alert and oriented to person, place, and time.     Cranial Nerves: No cranial nerve deficit, dysarthria or facial asymmetry.     Sensory: No sensory deficit.     Motor: No weakness.     Coordination: Coordination normal.     Gait: Gait normal.  Psychiatric:        Mood and Affect: Mood and affect normal.        Speech: Speech normal.        Behavior: Behavior normal. Behavior is cooperative.        Thought Content: Thought content is delusional.    ED Results / Procedures / Treatments   Labs (all labs ordered are listed, but only abnormal results are displayed) Labs Reviewed  COMPREHENSIVE METABOLIC PANEL - Abnormal; Notable for the following components:      Result Value   CO2 20 (*)    Glucose, Bld 102 (*)    Creatinine, Ser 1.07 (*)    GFR, Estimated 57 (*)    All other components within normal limits  RESP PANEL BY RT-PCR (FLU A&B, COVID) ARPGX2  CBC WITH DIFFERENTIAL/PLATELET  URINALYSIS, ROUTINE W REFLEX MICROSCOPIC    EKG None  Radiology DG Hip Unilat W or Wo Pelvis 2-3 Views Left  Result Date: 09/29/2020 CLINICAL DATA:  Left hip pain. EXAM: DG HIP (WITH OR WITHOUT PELVIS) 2-3V LEFT COMPARISON:  None. FINDINGS: Both hips are normally located. Mild degenerative changes. No fracture or AVN. The pubic symphysis and SI joints are intact. No pelvic fractures or bone lesions. IMPRESSION: Mild degenerative changes but no acute bony findings. Electronically Signed   By: 11/29/2020 M.D.   On: 09/29/2020 07:05    Procedures Procedures   Medications  Ordered in ED Medications  ibuprofen (ADVIL) tablet 600 mg (600 mg Oral Given 09/29/20 1350)    ED Course  I have reviewed the triage vital signs and the nursing notes.  Pertinent labs & imaging results that were available during my care of the patient were reviewed by me and considered in my medical decision making (see chart for details).    MDM Rules/Calculators/A&P                         Patient is a 65 year old female with history of chronic  delusions about voodoo dolls who presents for headache and left hip pain.  Vital signs on arrival notable for hypertension.  Patient does take antihypertensive medications at home.  She is well-appearing.  She does endorse a continued mild headache but states that it has improved without any interventions while she was in the waiting room.  Prior to being bedded in the ED, x-ray imaging of hip was obtained.  X-ray showed mild degenerative changes without any acute findings.  On exam, she has no focal neurologic deficits.  She is able to stand and ambulate without any significant discomfort in her left hip.  Despite her delusions, she is pleasant and cooperative.  Ibuprofen was given for analgesia.  I contacted her grandson, who she lives with, by telephone.  He stated that he will be on his way to come pick her up.  Patient was discharged in stable condition.   Final Clinical Impression(s) / ED Diagnoses Final diagnoses:  Acute nonintractable headache, unspecified headache type    Rx / DC Orders ED Discharge Orders     None        Gloris Manchester, MD 09/29/20 1851

## 2020-09-29 NOTE — ED Triage Notes (Signed)
Headache for 8 days  she is also c/o lt groin pain for 2 days

## 2020-11-15 ENCOUNTER — Other Ambulatory Visit: Payer: Self-pay | Admitting: Student

## 2020-11-15 DIAGNOSIS — G8929 Other chronic pain: Secondary | ICD-10-CM

## 2020-11-15 DIAGNOSIS — R519 Headache, unspecified: Secondary | ICD-10-CM

## 2020-12-02 ENCOUNTER — Inpatient Hospital Stay: Admission: RE | Admit: 2020-12-02 | Payer: Medicare Other | Source: Ambulatory Visit

## 2020-12-15 ENCOUNTER — Other Ambulatory Visit: Payer: Self-pay

## 2020-12-15 ENCOUNTER — Observation Stay (HOSPITAL_COMMUNITY)
Admission: EM | Admit: 2020-12-15 | Discharge: 2020-12-16 | Disposition: A | Discharge status: Home / Self Care | Payer: Medicare Other | Attending: Internal Medicine | Admitting: Internal Medicine

## 2020-12-15 ENCOUNTER — Observation Stay (HOSPITAL_COMMUNITY): Payer: Medicare Other

## 2020-12-15 ENCOUNTER — Encounter (HOSPITAL_COMMUNITY): Payer: Self-pay

## 2020-12-15 ENCOUNTER — Emergency Department (HOSPITAL_COMMUNITY): Payer: Medicare Other

## 2020-12-15 DIAGNOSIS — I10 Essential (primary) hypertension: Secondary | ICD-10-CM | POA: Insufficient documentation

## 2020-12-15 DIAGNOSIS — M542 Cervicalgia: Secondary | ICD-10-CM

## 2020-12-15 DIAGNOSIS — Y900 Blood alcohol level of less than 20 mg/100 ml: Secondary | ICD-10-CM | POA: Diagnosis not present

## 2020-12-15 DIAGNOSIS — G459 Transient cerebral ischemic attack, unspecified: Secondary | ICD-10-CM | POA: Diagnosis not present

## 2020-12-15 DIAGNOSIS — Z20822 Contact with and (suspected) exposure to covid-19: Secondary | ICD-10-CM | POA: Diagnosis not present

## 2020-12-15 DIAGNOSIS — R200 Anesthesia of skin: Secondary | ICD-10-CM | POA: Diagnosis present

## 2020-12-15 DIAGNOSIS — Z79899 Other long term (current) drug therapy: Secondary | ICD-10-CM | POA: Insufficient documentation

## 2020-12-15 DIAGNOSIS — R202 Paresthesia of skin: Secondary | ICD-10-CM

## 2020-12-15 LAB — CBC WITH DIFFERENTIAL/PLATELET
Abs Immature Granulocytes: 0.02 10*3/uL (ref 0.00–0.07)
Abs Immature Granulocytes: 0.01 10*3/uL (ref 0.00–0.07)
Basophils Absolute: 0 10*3/uL (ref 0.0–0.1)
Basophils Absolute: 0 10*3/uL (ref 0.0–0.1)
Basophils Relative: 1 %
Basophils Relative: 0 %
Eosinophils Absolute: 0.1 10*3/uL (ref 0.0–0.5)
Eosinophils Absolute: 0.2 10*3/uL (ref 0.0–0.5)
Eosinophils Relative: 2 %
Eosinophils Relative: 2 %
HCT: 41.3 % (ref 36.0–46.0)
HCT: 39 % (ref 36.0–46.0)
Hemoglobin: 13 g/dL (ref 12.0–15.0)
Hemoglobin: 12.3 g/dL (ref 12.0–15.0)
Immature Granulocytes: 0 %
Immature Granulocytes: 0 %
Lymphocytes Relative: 38 %
Lymphocytes Relative: 35 %
Lymphs Abs: 3.2 10*3/uL (ref 0.7–4.0)
Lymphs Abs: 2.5 10*3/uL (ref 0.7–4.0)
MCH: 27 pg (ref 26.0–34.0)
MCH: 27 pg (ref 26.0–34.0)
MCHC: 31.5 g/dL (ref 30.0–36.0)
MCHC: 31.5 g/dL (ref 30.0–36.0)
MCV: 85.7 fL (ref 80.0–100.0)
MCV: 85.5 fL (ref 80.0–100.0)
Monocytes Absolute: 0.4 10*3/uL (ref 0.1–1.0)
Monocytes Absolute: 0.4 10*3/uL (ref 0.1–1.0)
Monocytes Relative: 5 %
Monocytes Relative: 6 %
Neutro Abs: 4.5 10*3/uL (ref 1.7–7.7)
Neutro Abs: 4 10*3/uL (ref 1.7–7.7)
Neutrophils Relative %: 54 %
Neutrophils Relative %: 57 %
Platelets: 171 10*3/uL (ref 150–400)
Platelets: 166 10*3/uL (ref 150–400)
RBC: 4.82 MIL/uL (ref 3.87–5.11)
RBC: 4.56 MIL/uL (ref 3.87–5.11)
RDW: 15.1 % (ref 11.5–15.5)
RDW: 15.1 % (ref 11.5–15.5)
WBC: 8.2 10*3/uL (ref 4.0–10.5)
WBC: 7.2 10*3/uL (ref 4.0–10.5)
nRBC: 0 % (ref 0.0–0.2)
nRBC: 0 % (ref 0.0–0.2)

## 2020-12-15 LAB — BASIC METABOLIC PANEL
Anion gap: 5 (ref 5–15)
BUN: 13 mg/dL (ref 8–23)
CO2: 27 mmol/L (ref 22–32)
Calcium: 9.9 mg/dL (ref 8.9–10.3)
Chloride: 109 mmol/L (ref 98–111)
Creatinine, Ser: 1.16 mg/dL — ABNORMAL HIGH (ref 0.44–1.00)
GFR, Estimated: 52 mL/min — ABNORMAL LOW (ref >60)
Glucose, Bld: 91 mg/dL (ref 70–99)
Potassium: 4 mmol/L (ref 3.5–5.1)
Sodium: 141 mmol/L (ref 135–145)

## 2020-12-15 LAB — I-STAT CHEM 8, ED
BUN: 19 mg/dL (ref 8–23)
Calcium, Ion: 1 mmol/L — ABNORMAL LOW (ref 1.15–1.40)
Chloride: 113 mmol/L — ABNORMAL HIGH (ref 98–111)
Creatinine, Ser: 1.2 mg/dL — ABNORMAL HIGH (ref 0.44–1.00)
Glucose, Bld: 104 mg/dL — ABNORMAL HIGH (ref 70–99)
HCT: 33 % — ABNORMAL LOW (ref 36.0–46.0)
Hemoglobin: 11.2 g/dL — ABNORMAL LOW (ref 12.0–15.0)
Potassium: 5.5 mmol/L — ABNORMAL HIGH (ref 3.5–5.1)
Sodium: 138 mmol/L (ref 135–145)
TCO2: 24 mmol/L (ref 22–32)

## 2020-12-15 LAB — URINALYSIS, ROUTINE W REFLEX MICROSCOPIC
Bacteria, UA: NONE SEEN
Bilirubin Urine: NEGATIVE
Glucose, UA: NEGATIVE mg/dL
Ketones, ur: NEGATIVE mg/dL
Nitrite: NEGATIVE
Protein, ur: NEGATIVE mg/dL
Specific Gravity, Urine: 1.005 (ref 1.005–1.030)
pH: 6 (ref 5.0–8.0)

## 2020-12-15 LAB — LIPID PANEL
Cholesterol: 177 mg/dL (ref 0–200)
HDL: 58 mg/dL (ref >40)
LDL Cholesterol: 109 mg/dL — ABNORMAL HIGH (ref 0–99)
Total CHOL/HDL Ratio: 3.1 RATIO
Triglycerides: 49 mg/dL (ref <150)
VLDL: 10 mg/dL (ref 0–40)

## 2020-12-15 LAB — TSH: TSH: 1.836 u[IU]/mL (ref 0.350–4.500)

## 2020-12-15 LAB — COMPREHENSIVE METABOLIC PANEL
ALT: 20 U/L (ref 0–44)
AST: 24 U/L (ref 15–41)
Albumin: 3.7 g/dL (ref 3.5–5.0)
Alkaline Phosphatase: 80 U/L (ref 38–126)
Anion gap: 7 (ref 5–15)
BUN: 14 mg/dL (ref 8–23)
CO2: 26 mmol/L (ref 22–32)
Calcium: 10.3 mg/dL (ref 8.9–10.3)
Chloride: 106 mmol/L (ref 98–111)
Creatinine, Ser: 1.28 mg/dL — ABNORMAL HIGH (ref 0.44–1.00)
GFR, Estimated: 46 mL/min — ABNORMAL LOW (ref >60)
Glucose, Bld: 107 mg/dL — ABNORMAL HIGH (ref 70–99)
Potassium: 3.7 mmol/L (ref 3.5–5.1)
Sodium: 139 mmol/L (ref 135–145)
Total Bilirubin: 0.6 mg/dL (ref 0.3–1.2)
Total Protein: 7.4 g/dL (ref 6.5–8.1)

## 2020-12-15 LAB — RESP PANEL BY RT-PCR (FLU A&B, COVID) ARPGX2
Influenza A by PCR: NEGATIVE
Influenza B by PCR: NEGATIVE
SARS Coronavirus 2 by RT PCR: NEGATIVE

## 2020-12-15 LAB — HIV ANTIBODY (ROUTINE TESTING W REFLEX): HIV Screen 4th Generation wRfx: NONREACTIVE

## 2020-12-15 LAB — PROTIME-INR
INR: 1.1 (ref 0.8–1.2)
Prothrombin Time: 14 seconds (ref 11.4–15.2)

## 2020-12-15 LAB — APTT: aPTT: 32 seconds (ref 24–36)

## 2020-12-15 LAB — ETHANOL: Alcohol, Ethyl (B): <10 mg/dL (ref <10)

## 2020-12-15 LAB — HEMOGLOBIN A1C
Hgb A1c MFr Bld: 5.8 % — ABNORMAL HIGH (ref 4.8–5.6)
Mean Plasma Glucose: 119.76 mg/dL

## 2020-12-15 LAB — LDL CHOLESTEROL, DIRECT: Direct LDL: 101.7 mg/dL — ABNORMAL HIGH (ref 0–99)

## 2020-12-15 MED ORDER — NORTRIPTYLINE HCL 25 MG PO CAPS
25.0000 mg | ORAL_CAPSULE | Freq: Every day | ORAL | Status: DC
  Administered 2020-12-15: 25 mg via ORAL
  Filled 2020-12-15: qty 1

## 2020-12-15 MED ORDER — HYDROXYZINE HCL 25 MG PO TABS
25.0000 mg | ORAL_TABLET | Freq: Three times a day (TID) | ORAL | Status: DC
  Administered 2020-12-15 – 2020-12-16 (×3): 25 mg via ORAL
  Filled 2020-12-15 (×3): qty 1

## 2020-12-15 MED ORDER — ENOXAPARIN SODIUM 40 MG/0.4ML IJ SOSY
40.0000 mg | PREFILLED_SYRINGE | INTRAMUSCULAR | Status: DC
  Administered 2020-12-15: 40 mg via SUBCUTANEOUS
  Filled 2020-12-15: qty 0.4

## 2020-12-15 MED ORDER — LAMOTRIGINE 25 MG PO TABS
50.0000 mg | ORAL_TABLET | Freq: Every day | ORAL | Status: DC
  Administered 2020-12-15 – 2020-12-16 (×2): 50 mg via ORAL
  Filled 2020-12-15 (×2): qty 2

## 2020-12-15 MED ORDER — ACETAMINOPHEN 325 MG PO TABS: 650.0000 mg | ORAL_TABLET | Freq: Four times a day (QID) | ORAL | Status: DC | PRN

## 2020-12-15 MED ORDER — ASPIRIN EC 81 MG PO TBEC
81.0000 mg | DELAYED_RELEASE_TABLET | Freq: Every day | ORAL | Status: DC
  Administered 2020-12-15 – 2020-12-16 (×2): 81 mg via ORAL
  Filled 2020-12-15 (×2): qty 1

## 2020-12-15 MED ORDER — CLOPIDOGREL BISULFATE 75 MG PO TABS
75.0000 mg | ORAL_TABLET | Freq: Every day | ORAL | Status: DC
  Administered 2020-12-15 – 2020-12-16 (×2): 75 mg via ORAL
  Filled 2020-12-15 (×2): qty 1

## 2020-12-15 MED ORDER — ATORVASTATIN CALCIUM 10 MG PO TABS
20.0000 mg | ORAL_TABLET | Freq: Every day | ORAL | Status: DC
  Administered 2020-12-15 – 2020-12-16 (×2): 20 mg via ORAL
  Filled 2020-12-15 (×2): qty 2

## 2020-12-15 MED ORDER — ACETAMINOPHEN 650 MG RE SUPP: 650.0000 mg | Freq: Four times a day (QID) | RECTAL | Status: DC | PRN

## 2020-12-15 MED ORDER — MIRABEGRON ER 25 MG PO TB24
25.0000 mg | ORAL_TABLET | Freq: Every day | ORAL | Status: DC
  Administered 2020-12-15: 25 mg via ORAL
  Filled 2020-12-15: qty 1

## 2020-12-15 MED ORDER — AMLODIPINE BESYLATE 2.5 MG PO TABS
2.5000 mg | ORAL_TABLET | Freq: Every day | ORAL | Status: DC
  Administered 2020-12-15 – 2020-12-16 (×2): 2.5 mg via ORAL
  Filled 2020-12-15 (×2): qty 1

## 2020-12-15 NOTE — ED Notes (Signed)
Pt given meal bag with orange juice

## 2020-12-15 NOTE — ED Notes (Signed)
Sent purple man note and called for Report Morrie Sheldon is off the unit moving a paitient will get back to me asap

## 2020-12-15 NOTE — ED Provider Notes (Signed)
65 year old female with now 2 episodes of left-sided numbness and weakness lasting few hours at a time and then completely resolved.  Plan for MRI of the brain and discussion with neurology.  I discussed with Dr. Selina Cooley, neurology recommended medical admission for TIA.     Melene Plan, DO 12/15/20 1121

## 2020-12-15 NOTE — ED Triage Notes (Signed)
Pt woke up from a nap tonight at 1130pm with "pins and needles" on the L side of her body, neuro intact bilaterally, pt reports pain in L leg that shoots down

## 2020-12-15 NOTE — ED Notes (Signed)
Patient transported to X-ray 

## 2020-12-15 NOTE — Consult Note (Signed)
Neurology Consultation  Reason for Consult: Two episodes of left-sided weakness  Referring Physician: Dr. Adela Lank  CC: "My left side was shaking and I couldn't walk, stand, sit, or see"  History is obtained from: Patient, Chart review  HPI: Teresa Green is a 65 y.o. female with a medical history significant for obesity, hypertension, schizophrenia, and bipolar disorder who presented to the ED 11/24 for evaluation of two transient episodes of left-sided shaking. Previous documentation supports patient had also complained of "tingling and numbness" and "difficulty moving the left arm and leg" but patient currently denies any sensory changes. She states that the symptoms occurred approximately 2 days ago and lasted approximately 4 hours prior to resolution and that they again started last night and lasted approximately 4 hours prior to resolution. She describes the event as left arm and leg shaking "like kicking up and down like the something was coming out of it, like the energy was coming out of it" and endorses that she was unable to stand due to the shaking. She also endorses soreness today of the left hemibody including her left face. She states that during this event her speech was "drawing" but also with resolution.   LKW: Patient is at baseline status during evaluation TNK given?: no, patient is without residual deficits IR Thrombectomy? No, presentation is not consistent with an LVO Modified Rankin Scale: 0-Completely asymptomatic and back to baseline post- stroke  ROS: A complete ROS was performed and is negative except as noted in the HPI.   Past Medical History: Obesity Hypertension Schizophrenia Bipolar disorder  No family history on file.  Social History:   reports that she has never smoked. She has never used smokeless tobacco. No history on file for alcohol use and drug use.  Medications No current facility-administered medications for this encounter.  Current Outpatient  Medications:    amLODipine (NORVASC) 2.5 MG tablet, Take 2.5 mg by mouth daily., Disp: , Rfl:    ARIPiprazole (ABILIFY) 10 MG tablet, Take 10 mg by mouth daily., Disp: , Rfl:    atorvastatin (LIPITOR) 20 MG tablet, Take 20 mg by mouth daily., Disp: , Rfl:    cetirizine (ZYRTEC) 10 MG tablet, Take 10 mg by mouth daily as needed for allergies., Disp: , Rfl:    Cholecalciferol (VITAMIN D3 PO), Take 1 tablet by mouth daily., Disp: , Rfl:    diclofenac Sodium (VOLTAREN) 1 % GEL, Apply 1 application topically 4 (four) times daily as needed (hip pain)., Disp: , Rfl:    fluticasone (FLONASE) 50 MCG/ACT nasal spray, Place 1 spray into both nostrils daily as needed for allergies., Disp: , Rfl:    hydrOXYzine (ATARAX/VISTARIL) 25 MG tablet, Take 25 mg by mouth 3 (three) times daily., Disp: , Rfl:    losartan (COZAAR) 50 MG tablet, Take 50 mg by mouth daily., Disp: , Rfl:    MYRBETRIQ 25 MG TB24 tablet, Take 25 mg by mouth at bedtime., Disp: , Rfl:    nortriptyline (PAMELOR) 25 MG capsule, Take 25 mg by mouth at bedtime., Disp: , Rfl:    telmisartan (MICARDIS) 40 MG tablet, Take 40 mg by mouth daily., Disp: , Rfl:    lamoTRIgine (LAMICTAL) 25 MG tablet, Take 50 mg by mouth daily., Disp: , Rfl:   Exam: Current vital signs: BP (!) 109/98   Pulse 61   Temp 98.3 F (36.8 C) (Oral)   Resp 17   Ht 5\' 3"  (1.6 m)   Wt 110.2 kg   SpO2 95%  BMI 43.05 kg/m  Vital signs in last 24 hours: Temp:  [97.9 F (36.6 C)-98.3 F (36.8 C)] 98.3 F (36.8 C) (11/24 0451) Pulse Rate:  [61-75] 61 (11/24 0930) Resp:  [15-30] 17 (11/24 0930) BP: (109-185)/(55-98) 109/98 (11/24 0930) SpO2:  [95 %-100 %] 95 % (11/24 0930) Weight:  [110.2 kg] 110.2 kg (11/24 0446)  GENERAL: Drowsy, wakes to voice, alert, in no acute distress Psych: Affect is flat, patient is calm and cooperative with examination Head: Normocephalic and atraumatic, without obvious abnormality EENT: Normal conjunctivae, dry mucous membranes, no OP  obstruction LUNGS: Normal respiratory effort. Non-labored breathing on room air CV: Regular rate and rhythm on telemetry ABDOMEN: Soft, non-tender, non-distended Extremities: warm, well perfused, without obvious deformity  NEURO:  Mental Status: Drowsy, wakes to voice, quickly falls asleep during assessment. She is oriented to person, place, time, and situation. She is able to provide some details regarding her history of present illness though her reporting appears to be variable throughout each provider assessment.  Speech/Language: speech sounds mildly dysarthric 2/2 edentulous state but at baseline per patient.   Naming, repetition, fluency, and comprehension intact without aphasia. No neglect is noted Cranial Nerves:  II: PERRL 2 mm/brisk. Visual fields full.  III, IV, VI: EOMI without ptosis, diplopia, or gaze preference.  V: Sensation is intact to light touch and symmetrical to face.  VII: Face is symmetric resting and smiling. Able to puff cheeks and raise eyebrows.  VIII: Hearing is intact to voice IX, X: Palate elevation is symmetric. Phonation normal.  XI: Normal sternocleidomastoid and trapezius muscle strength XII: Tongue protrudes midline without fasciculations.   Motor: 5/5 strength is all muscle groups without vertical drift.  Tone is normal. Bulk is normal.  Sensation: Intact to light touch bilaterally in all four extremities. No extinction to DSS present.  Coordination: FTN intact bilaterally. HKS intact bilaterally. No pronator drift.  DTRs: 2+ and symmetric throughout.  Gait: Deferred  NIHSS: 0  Labs I have reviewed labs in epic and the results pertinent to this consultation are: CBC    Component Value Date/Time   WBC 7.2 12/15/2020 0818   RBC 4.56 12/15/2020 0818   HGB 12.3 12/15/2020 0818   HCT 39.0 12/15/2020 0818   PLT 166 12/15/2020 0818   MCV 85.5 12/15/2020 0818   MCH 27.0 12/15/2020 0818   MCHC 31.5 12/15/2020 0818   RDW 15.1 12/15/2020 0818    LYMPHSABS 2.5 12/15/2020 0818   MONOABS 0.4 12/15/2020 0818   EOSABS 0.2 12/15/2020 0818   BASOSABS 0.0 12/15/2020 0818   CMP     Component Value Date/Time   NA 138 12/15/2020 0617   K 5.5 (H) 12/15/2020 0617   CL 113 (H) 12/15/2020 0617   CO2 26 12/15/2020 0046   GLUCOSE 104 (H) 12/15/2020 0617   BUN 19 12/15/2020 0617   CREATININE 1.20 (H) 12/15/2020 0617   CALCIUM 10.3 12/15/2020 0046   PROT 7.4 12/15/2020 0046   ALBUMIN 3.7 12/15/2020 0046   AST 24 12/15/2020 0046   ALT 20 12/15/2020 0046   ALKPHOS 80 12/15/2020 0046   BILITOT 0.6 12/15/2020 0046   GFRNONAA 46 (L) 12/15/2020 0046   Lipid Panel  No results found for: CHOL, TRIG, HDL, CHOLHDL, VLDL, LDLCALC, LDLDIRECT No results found for: HGBA1C  Imaging I have reviewed the images obtained:  CT-scan of the brain 11/24: Negative head CT.  MRI examination of the brain 11/24: Unremarkable brain MRI for age.  Assessment: 65 y.o. female who presented  to the ED 11/24 for evaluation of a transient episode of left hemibody shaking affecting her ability to walk that lasted approximately 4 hours prior to hospital arrival. She endorses a similar episode approximately 2 days ago that also lasted for about 4 hours.  - Examination reveals patient that is drowsy and quickly falls back to sleep during assessment without noted weakness or sensory deficits. Her NIHSS is a 0. - CT and MRI brain imaging were obtained without evidence of acute intracranial abnormality. - Presentation most c/w TIA  Recommendations: - See attending attestation   Pt seen by NP/Neuro and later by MD. Note/plan to be edited by MD as needed.  Lanae Boast, AGAC-NP [facility] Pager: [phone]  Neurology Attending Attestation   I examined the patient and discussed plan with Ms. Toberman NP. Above note has been edited by me to reflect my findings and recommendations. I personally reviewed all CNS imaging. Patient with hx HTN, HL p/w 2  discrete episodes of transient L sided weakness and numbness of face/arm/leg c/f TIA. On my examination she is neuro intact with NIHSS = 0. MRI brain w/o e/o acute infarct. She takes a daily ASA 81mg  at home. Given that she has had 2 events in the past 2 days, I will start her on dual antiplatelet therapy. TIA workup as follows:  - Goal normotension given acute infarct ruled out by MRI. Avoid hypotension.  - MRA H&N - TTE - Check A1c and LDL + add statin per guidelines - ASA 81mg  daily + plavix 75mg  daily x21 days f/b plavix 75mg  daily monotherapy after that - q4 hr neuro checks - STAT head CT for any change in neuro exam - Tele - PT/OT/SLP - Stroke education - Amb referral to neurology upon discharge   Stroke team will continue to follow.    , MD [facility] [phone]   If 7pm- 7am, please page neurology on call as listed in AMION.

## 2020-12-15 NOTE — ED Provider Notes (Signed)
MSE was initiated and I personally evaluated the patient and placed orders (if any) at  12:34 AM on December 15, 2020.  Patient to ED with nonspecific pain. She reports 2 days ago she had pain in bilateral thighs. Tonight she has pain in the left arm and left hip into leg. She gives a history that "they wanted to cut my leg off" but no record in chart. No other symptoms.   Awake, alert No neuro deficits Distal pulses intact  The patient appears stable so that the remainder of the MSE may be completed by another provider.   Elpidio Anis, PA-C 12/15/20 0037    Dione Booze, MD 12/15/20 [phone]

## 2020-12-15 NOTE — H&P (Signed)
Date: 12/15/2020               Patient Name:  Teresa Green MRN: [id number]  DOB: 28-Feb-1955 Age / Sex: 65 y.o., female   PCP: Hillery Aldo, NP         Medical Service: Internal Medicine Teaching Service         Attending Physician: Dr. Gust Rung, DO    First Contact: Dr. Elza Rafter Pager: [phone]  Second Contact: Dr. Doran Stabler  Pager: [phone]       After Hours (After 5p/  First Contact Pager: [phone]  weekends / holidays): Second Contact Pager: [phone]   Chief Complaint: Left-sided weakness  History of Present Illness:   Teresa Green is a 65 year old female living with schizophrenia, bipolar disorder, hypertension who presented to the ED for an episode of left upper and lower extremity weakness.  Patient reports waking up of in the middle of the night last night because her left side was "messed up".  States that she could not move her left arm and left leg.  Endorses tingling and numbness sensation as well as pain of left LE and UE extremity.  She states that it lasted until 10 AM this morning.  She also endorses slurring speech with problems swallowing at that time.  No issue with her vision. She denies chest pain or shortness of breath.  To her left-sided weakness, she reports a fall onto her left side.  She endorses sided neck pain after.  Per ED provider, she has similar symptoms 2 days ago that resolved within a few hours.  Her left-sided symptoms have resolved during examination.  Now patient endorses tingling and numbness of her right leg.  She endorses taking her medication consistently, only miss 1 or 2 days intermittently.  She denies auditory or visual hallucination.  She is living at home with her grandson which she feels safe.  She state that she is feeling anxious because she thinks that her family is hurting her with witchcraft.  She last saw her PCP and behavioral health provider 2 weeks ago.  In the ED, CT head and brain MRI were both  unremarkable.  Neurology was consulted and advised to admit patient for TIA.  Meds:  Current Meds  Medication Sig   amLODipine (NORVASC) 2.5 MG tablet Take 2.5 mg by mouth daily.   ARIPiprazole (ABILIFY) 10 MG tablet Take 10 mg by mouth daily.   atorvastatin (LIPITOR) 20 MG tablet Take 20 mg by mouth daily.   cetirizine (ZYRTEC) 10 MG tablet Take 10 mg by mouth daily as needed for allergies.   Cholecalciferol (VITAMIN D3 PO) Take 1 tablet by mouth daily.   diclofenac Sodium (VOLTAREN) 1 % GEL Apply 1 application topically 4 (four) times daily as needed (hip pain).   fluticasone (FLONASE) 50 MCG/ACT nasal spray Place 1 spray into both nostrils daily as needed for allergies.   hydrOXYzine (ATARAX/VISTARIL) 25 MG tablet Take 25 mg by mouth 3 (three) times daily.   losartan (COZAAR) 50 MG tablet Take 50 mg by mouth daily.   MYRBETRIQ 25 MG TB24 tablet Take 25 mg by mouth at bedtime.   nortriptyline (PAMELOR) 25 MG capsule Take 25 mg by mouth at bedtime.   telmisartan (MICARDIS) 40 MG tablet Take 40 mg by mouth daily.   [DISCONTINUED] nortriptyline (PAMELOR) 50 MG capsule Take 50 mg by mouth at bedtime.     Allergies: Allergies as of 12/15/2020   (No Known Allergies)  History reviewed. No pertinent past medical history.  Family History:  Father: Heart attack Mother: Throat cancer  Social History:  No alcohol, smoking or drug use Lives with her grandson  Review of Systems: A complete ROS was negative except as per HPI.   Physical Exam: Blood pressure (!) 153/74, pulse 78, temperature 98.3 F (36.8 C), temperature source Oral, resp. rate 14, height 5\' 3"  (1.6 m), weight 110.2 kg, SpO2 97 %. Physical Exam Constitutional:      General: She is not in acute distress.    Appearance: She is not ill-appearing.  HENT:     Head: Normocephalic.  Eyes:     General:        Right eye: No discharge.        Left eye: No discharge.     Extraocular Movements: Extraocular movements intact.      Conjunctiva/sclera: Conjunctivae normal.     Pupils: Pupils are equal, round, and reactive to light.  Cardiovascular:     Rate and Rhythm: Normal rate and regular rhythm.     Heart sounds: No murmur heard.    Comments: No LE edema Pulmonary:     Effort: Pulmonary effort is normal. No respiratory distress.     Breath sounds: Normal breath sounds. No wheezing.  Abdominal:     General: Bowel sounds are normal.     Palpations: Abdomen is soft.  Musculoskeletal:        General: Normal range of motion.     Cervical back: Normal range of motion and neck supple. Tenderness (Tenderness to palpation of left neck) present. No rigidity.  Skin:    General: Skin is warm.     Coloration: Skin is not jaundiced.  Neurological:     Mental Status: She is alert.     Comments: PERRLA.  EOM intact Cranial nerves intact 5/5 strength of bilateral lower and upper extremity Intact sensation bilaterally No pronator drift Normal finger-to-nose test  Psychiatric:        Mood and Affect: Mood normal.     EKG: personally reviewed my interpretation is normal sinus rhythm   Assessment & Plan by Problem: Principal Problem:   TIA (transient ischemic attack)  Teresa Green is a 65 year old female living with schizophrenia, bipolar disorder, hypertension who presented to the ED for an episode of left upper and lower extremity weakness concerning for TIA.  Transient ischemic attack Her episode of left-sided weakness and change in sensation are suggestive of a TIA episode.  CT head and brain MRI were normal here.  MRA of head and neck however were unremarkable.  There were only a moderate stenosis of right P1.  The MRA results are not consistent the severity of her symptoms.  Cervical spine x-ray also did not reveal any fracture or neuroforaminal impingement.  Will monitor overnight for any further episode. -Appreciate neurology recommendation -Will check lipid panel, A1c and TSH -Pending  echocardiogram -Start aspirin and Plavix -Frequent neurochecks -PT/OT  Hypertension Her home meds for hypertension include amlodipine, telmisartan and losartan.  Unsure of which ones she is taking.  Patient does not remember. -Will resume amlodipine 2.5 mg.  Avoid dropping blood pressure too quickly.  Schizophrenia Bipolar disorder Anxiety -Resume nortriptyline and lamotrigine -Resume hydroxyzine -Patient report not taking Abilify.  Discontinue by her provider.  Full code Diet: Regular IVF: N/A DVT: Lovenox  Dispo: Admit patient to Observation with expected length of stay less than 2 midnights.  Signed: 70, DO 12/15/2020, 4:35 PM  Pager: [phone]  After 5pm on weekdays and 1pm on weekends: On Call pager: [phone]

## 2020-12-15 NOTE — ED Provider Notes (Signed)
[facility] EMERGENCY DEPARTMENT Provider Note   CSN: [id number] Arrival date & time: 12/15/20  0026     History Chief Complaint  Patient presents with   Numbness    Teresa Green is a 65 y.o. female.  Level 5 caveat for questionable mental status change.  Patient with unspecified psychiatric illness.  She is here with "tingling and numbness" in "difficulty moving left arm and left leg" as well as pain in her left arm and left leg.  Symptoms now resolved.  States symptoms happened earlier tonight and lasted about 4 hours.  States she was unable to move her left arm or left leg at all and could not walk.  She denies feeling pins-and-needles feeling but says her arm and leg were hurting but also felt weak.  This included the left side of her face and left side of her trunk.  Symptoms resolved on their own.  She had a similar episode 2 days ago and lasted for several hours that she was not evaluated for. She denies any headache.  She denies any visual changes.  No chest pain or shortness of breath.  No difficulty speaking or difficulty swallowing.  No alcohol or drug use.  States she is sleepy today because she took nortriptyline prior to arrival. She denies any illicit drug or alcohol use.  She denies any fever or visual changes.  The history is provided by the patient.      History reviewed. No pertinent past medical history.  Patient Active Problem List   Diagnosis Date Noted   Acute psychosis ([place])     History reviewed. No pertinent surgical history.   OB History   No obstetric history on file.     No family history on file.  Social History   Tobacco Use   Smoking status: Never   Smokeless tobacco: Never    Home Medications Prior to Admission medications   Medication Sig Start Date End Date Taking? Authorizing Provider  amLODipine (NORVASC) 2.5 MG tablet Take 2.5 mg by mouth daily. 07/23/20   [provider]  atorvastatin (LIPITOR) 20 MG  tablet Take 20 mg by mouth daily. 07/23/20   [provider]  cetirizine (ZYRTEC) 10 MG tablet Take 10 mg by mouth daily as needed for allergies. 06/28/16   [provider]  fluticasone (FLONASE) 50 MCG/ACT nasal spray Place 1 spray into both nostrils daily as needed for allergies. 05/25/20   [provider]  MYRBETRIQ 25 MG TB24 tablet Take 25 mg by mouth daily. 07/23/20   [provider]  nortriptyline (PAMELOR) 50 MG capsule Take 50 mg by mouth at bedtime. 03/25/20   [provider]  telmisartan (MICARDIS) 40 MG tablet Take 40 mg by mouth daily. 07/23/20   [provider]    Allergies    Patient has no known allergies.  Review of Systems   Review of Systems  Constitutional:  Negative for activity change, appetite change and fever.  HENT:  Negative for congestion.   Respiratory:  Negative for cough, chest tightness and shortness of breath.   Cardiovascular:  Negative for chest pain.  Gastrointestinal:  Negative for abdominal pain, nausea and vomiting.  Genitourinary:  Negative for dysuria.  Musculoskeletal:  Positive for arthralgias and myalgias.  Skin:  Negative for wound.  Neurological:  Positive for weakness and numbness. Negative for seizures, facial asymmetry, speech difficulty, light-headedness and headaches.   all other systems are negative except as noted in the HPI and  PMH.   Physical Exam Updated Vital Signs BP (!) 141/71   Pulse 63   Temp 98.3 F (36.8 C) (Oral)   Resp 16   Ht 5\' 3"  (1.6 m)   Wt 110.2 kg   SpO2 98%   BMI 43.05 kg/m   Physical Exam Vitals and nursing note reviewed.  Constitutional:      General: She is not in acute distress.    Appearance: She is well-developed.     Comments: Sleepy but arousable, answers questions appropriately, oriented times 3.  HENT:     Head: Normocephalic and atraumatic.     Mouth/Throat:     Pharynx: No oropharyngeal exudate.  Eyes:     Conjunctiva/sclera: Conjunctivae  normal.     Pupils: Pupils are equal, round, and reactive to light.  Neck:     Comments: No meningismus. Cardiovascular:     Rate and Rhythm: Normal rate and regular rhythm.     Heart sounds: Normal heart sounds. No murmur heard. Pulmonary:     Effort: Pulmonary effort is normal. No respiratory distress.     Breath sounds: Normal breath sounds.  Abdominal:     Palpations: Abdomen is soft.     Tenderness: There is no abdominal tenderness. There is no guarding or rebound.  Musculoskeletal:        General: No tenderness. Normal range of motion.     Cervical back: Normal range of motion and neck supple.  Skin:    General: Skin is warm.  Neurological:     Mental Status: She is alert and oriented to person, place, and time.     Cranial Nerves: No cranial nerve deficit.     Motor: No abnormal muscle tone.     Coordination: Coordination normal.     Comments: CN 2-12 intact, no ataxia on finger to nose, no nystagmus, 5/5 strength throughout, no pronator drift  Equal sensation throughout   Psychiatric:        Behavior: Behavior normal.    ED Results / Procedures / Treatments   Labs (all labs ordered are listed, but only abnormal results are displayed) Labs Reviewed  COMPREHENSIVE METABOLIC PANEL - Abnormal; Notable for the following components:      Result Value   Glucose, Bld 107 (*)    Creatinine, Ser 1.28 (*)    GFR, Estimated 46 (*)    All other components within normal limits  URINALYSIS, ROUTINE W REFLEX MICROSCOPIC - Abnormal; Notable for the following components:   Color, Urine STRAW (*)    Hgb urine dipstick SMALL (*)    Leukocytes,Ua SMALL (*)    All other components within normal limits  I-STAT CHEM 8, ED - Abnormal; Notable for the following components:   Potassium 5.5 (*)    Chloride 113 (*)    Creatinine, Ser 1.20 (*)    Glucose, Bld 104 (*)    Calcium, Ion 1.00 (*)    Hemoglobin 11.2 (*)    HCT 33.0 (*)    All other components within normal limits  RESP  PANEL BY RT-PCR (FLU A&B, COVID) ARPGX2  CBC WITH DIFFERENTIAL/PLATELET  ETHANOL  CBC  DIFFERENTIAL  RAPID URINE DRUG SCREEN, HOSP PERFORMED  PROTIME-INR  APTT    EKG EKG Interpretation  Date/Time:  Thursday December 15 2020 06:24:06 EST Ventricular Rate:  55 PR Interval:  145 QRS Duration: 79 QT Interval:  399 QTC Calculation: 382 R Axis:   72 Text Interpretation: Sinus rhythm No significant change was found Confirmed by  Ezequiel Essex [id number]) on 12/15/2020 6:33:16 AM  Radiology CT HEAD WO CONTRAST  Result Date: 12/15/2020 CLINICAL DATA:  Acute stroke suspected. Numbness and tingling on the left side of body since last night EXAM: CT HEAD WITHOUT CONTRAST TECHNIQUE: Contiguous axial images were obtained from the base of the skull through the vertex without intravenous contrast. COMPARISON:  07/05/2020 FINDINGS: Brain: No evidence of acute infarction, hemorrhage, hydrocephalus, extra-axial collection or mass lesion/mass effect. Vascular: No hyperdense vessel or unexpected calcification. Skull: Normal. Negative for fracture or focal lesion. Sinuses/Orbits: No acute finding. IMPRESSION: Negative head CT. Electronically Signed   By: Jorje Guild M.D.   On: 12/15/2020 06:20    Procedures Procedures   Medications Ordered in ED Medications - No data to display  ED Course  I have reviewed the triage vital signs and the nursing notes.  Pertinent labs & imaging results that were available during my care of the patient were reviewed by me and considered in my medical decision making (see chart for details).    MDM Rules/Calculators/A&P                           Patient here with episode of difficulty moving her left arm and left leg.  She describes both pain as well as weakness but denies paresthesias.  She is has a normal neurological exam currently.  Reports similar episode 2 days ago.  Code stroke not activated due to resolution of symptoms.  Will obtain labs as well as CT  imaging. Difficult to ascertain whether patient having true weakness versus pain.  CT head is normal.  Labs are reassuring.  Drug screen is pending.  Patient remains sleepy but arousable.  She has no weakness currently.  Question possible TIA.  MRI will be obtained for further evaluation.  Patient reports she did have weakness and pain in her arm and her leg 2 days ago as well as today for several hours but has a normal exam currently. May also need neurology evaluation.  Care to be transferred at shift change Final Clinical Impression(s) / ED Diagnoses Final diagnoses:  None    Rx / DC Orders ED Discharge Orders     None        Shirline Kendle, Annie Main, MD 12/15/20 [id number]

## 2020-12-15 NOTE — ED Notes (Signed)
Patient transported to CT 

## 2020-12-16 ENCOUNTER — Other Ambulatory Visit (HOSPITAL_COMMUNITY): Payer: Medicare Other

## 2020-12-16 DIAGNOSIS — G459 Transient cerebral ischemic attack, unspecified: Secondary | ICD-10-CM | POA: Diagnosis not present

## 2020-12-16 LAB — BASIC METABOLIC PANEL
Anion gap: 5 (ref 5–15)
BUN: 14 mg/dL (ref 8–23)
CO2: 25 mmol/L (ref 22–32)
Calcium: 9.7 mg/dL (ref 8.9–10.3)
Chloride: 109 mmol/L (ref 98–111)
Creatinine, Ser: 1.15 mg/dL — ABNORMAL HIGH (ref 0.44–1.00)
GFR, Estimated: 53 mL/min — ABNORMAL LOW (ref >60)
Glucose, Bld: 100 mg/dL — ABNORMAL HIGH (ref 70–99)
Potassium: 3.8 mmol/L (ref 3.5–5.1)
Sodium: 139 mmol/L (ref 135–145)

## 2020-12-16 LAB — CBC
HCT: 39.2 % (ref 36.0–46.0)
Hemoglobin: 12.7 g/dL (ref 12.0–15.0)
MCH: 27.5 pg (ref 26.0–34.0)
MCHC: 32.4 g/dL (ref 30.0–36.0)
MCV: 85 fL (ref 80.0–100.0)
Platelets: 176 10*3/uL (ref 150–400)
RBC: 4.61 MIL/uL (ref 3.87–5.11)
RDW: 15.3 % (ref 11.5–15.5)
WBC: 7.3 10*3/uL (ref 4.0–10.5)
nRBC: 0 % (ref 0.0–0.2)

## 2020-12-16 MED ORDER — LOSARTAN POTASSIUM 50 MG PO TABS
50.0000 mg | ORAL_TABLET | Freq: Every day | ORAL | Status: DC
  Administered 2020-12-16: 50 mg via ORAL
  Filled 2020-12-16: qty 1

## 2020-12-16 MED ORDER — ASPIRIN 81 MG PO TBEC: 81.0000 mg | DELAYED_RELEASE_TABLET | Freq: Every day | ORAL | 0 refills | Status: DC

## 2020-12-16 MED ORDER — CLOPIDOGREL BISULFATE 75 MG PO TABS: 75.0000 mg | ORAL_TABLET | Freq: Every day | ORAL | 0 refills | Status: DC

## 2020-12-16 MED ORDER — ATORVASTATIN CALCIUM 40 MG PO TABS: 40.0000 mg | ORAL_TABLET | Freq: Every day | ORAL | Status: DC

## 2020-12-16 MED ORDER — ATORVASTATIN CALCIUM 40 MG PO TABS: 40.0000 mg | ORAL_TABLET | Freq: Every day | ORAL | 0 refills | Status: AC

## 2020-12-16 NOTE — Progress Notes (Addendum)
STROKE TEAM PROGRESS NOTE   INTERVAL HISTORY Patient is seen in her room with no family at the bedside.  The patient history given to assess different from presentation.  She denies any tingling numbness but states that she developed sudden onset of jerking of the left body which lasted about 4 hours.  She presented to the ED yesterday with an episode of shaking in her left arm and leg with difficulty moving these extremities and slurred speech.  She also endorses auditory hallucinations during this time which were bothersome to her.  Symptoms resolved spontaneously and patient was at her baseline on arrival to the ED.  She continues to endorse sensory changes on the left side of her body but is otherwise at baseline.  Patient states she has underlying history of psychiatric disorder and does take Pamelor and aripiprazole.  She recently complained of auditory hallucinations and her psychiatrist increased her medications. MRI scan of the brain is unremarkable and MR angiogram study of the brain and neck both did not show large vessel stenosis or occlusion.  LDL cholesterol is 109 mg percent and hemoglobin A1c is 5.8. Vitals:   12/15/20 1853 12/16/20 0113 12/16/20 0355 12/16/20 1000  BP: (!) 155/61 (!) 136/58 (!) 143/62 (!) 124/54  Pulse: 63 61 68 67  Resp:  16 16 17   Temp: 98.5 F (36.9 C) 98.2 F (36.8 C) 98.2 F (36.8 C) 98 F (36.7 C)  TempSrc: Oral Oral Oral Oral  SpO2: 100% 95% 97% 100%  Weight:      Height:       CBC:  Recent Labs  Lab 12/15/20 0046 12/15/20 0617 12/15/20 0818 12/16/20 0238  WBC 8.2  --  7.2 7.3  NEUTROABS 4.5  --  4.0  --   HGB 13.0   < > 12.3 12.7  HCT 41.3   < > 39.0 39.2  MCV 85.7  --  85.5 85.0  PLT 171  --  166 176   < > = values in this interval not displayed.   Basic Metabolic Panel:  Recent Labs  Lab 12/15/20 1502 12/16/20 0238  NA 141 139  K 4.0 3.8  CL 109 109  CO2 27 25  GLUCOSE 91 100*  BUN 13 14  CREATININE 1.16* 1.15*  CALCIUM 9.9  9.7   Lipid Panel:  Recent Labs  Lab 12/15/20 1502  CHOL 177  TRIG 49  HDL 58  CHOLHDL 3.1  VLDL 10  LDLCALC 12/17/20*   HgbA1c:  Recent Labs  Lab 12/15/20 1502  HGBA1C 5.8*   Urine Drug Screen: No results for input(s): LABOPIA, COCAINSCRNUR, LABBENZ, AMPHETMU, THCU, LABBARB in the last 168 hours.  Alcohol Level  Recent Labs  Lab 12/15/20 0534  ETH <10    IMAGING past 24 hours DG Cervical Spine Complete  Result Date: 12/15/2020 CLINICAL DATA:  Patient woke up from a nap with pins and needles in the left side of body. Neuro intact bilaterally. EXAM: CERVICAL SPINE - COMPLETE 4+ VIEW COMPARISON:  None. FINDINGS: Multilevel degenerative changes most marked at C5-6 and C6-7. No malalignment or fracture. The pre odontoid space and prevertebral soft tissues are normal. The neural foramina are grossly patent. No other abnormalities. IMPRESSION: Degenerative changes. No other cause for the patient's symptoms identified. Electronically Signed   By: 12/17/2020 III M.D.   On: 12/15/2020 14:16   MR ANGIO HEAD WO CONTRAST  Result Date: 12/15/2020 CLINICAL DATA:  Neuro deficit, acute, stroke suspected. 2 episodes of left-sided numbness  and weakness. EXAM: MRA HEAD WITHOUT CONTRAST TECHNIQUE: Angiographic images of the Circle of Willis were acquired using MRA technique without intravenous contrast. COMPARISON:  No pertinent prior exam. FINDINGS: Anterior circulation: The internal carotid arteries are widely patent from skull base to carotid termini. ACAs and MCAs are patent without evidence of a proximal branch occlusion or significant proximal stenosis. Assessment of the right MCA bifurcation is mildly limited by motion artifact. No aneurysm is identified. Posterior circulation: The included distal vertebral arteries are widely patent to the basilar and codominant. Patent PICA and SCA origins are seen bilaterally. The basilar artery is widely patent. There is a moderately large right posterior  communicating artery. Both PCAs are patent without evidence of a significant proximal stenosis on the left. There is a moderate right P1 stenosis. No aneurysm is identified. Anatomic variants: None. IMPRESSION: 1. No major intracranial arterial occlusion. 2. Moderate right P1 stenosis. Electronically Signed   By: Sebastian Ache M.D.   On: 12/15/2020 15:35   MR ANGIO NECK WO CONTRAST  Result Date: 12/15/2020 CLINICAL DATA:  Neuro deficit, acute, stroke suspected. 2 episodes of left-sided numbness and weakness. EXAM: MRA NECK WITHOUT CONTRAST TECHNIQUE: Angiographic images of the neck were acquired using MRA technique without intravenous contrast. Carotid stenosis measurements (when applicable) are obtained utilizing NASCET criteria, using the distal internal carotid diameter as the denominator. COMPARISON:  No pertinent prior exam. FINDINGS: Assessment is limited by noncontrast technique and mild motion artifact. Aortic arch: Not imaged on this noncontrast study. Right carotid system: Patent without evidence of significant stenosis or dissection. Left carotid system: Patent without evidence of significant stenosis or dissection. Vertebral arteries: The vertebral arteries are patent and codominant with antegrade flow bilaterally and no evidence of a significant stenosis or dissection although assessment of the proximal V1 segments is limited by motion, and the origin of the left vertebral artery was not imaged. IMPRESSION: Negative noncontrast neck MRA. Electronically Signed   By: Sebastian Ache M.D.   On: 12/15/2020 15:38    PHYSICAL EXAM General:  Patient is a well-developed, well-nourished middle-age African-American female in no acute distress.   NEURO:  Mental Status: AA&Ox3  Speech/Language: speech is without dysarthria or aphasia.  Naming, repetition, fluency, and comprehension intact.  Cranial Nerves:  II: PERRL. Visual fields full.  III, IV, VI: EOMI. Eyelids elevate symmetrically.  V: Sensation  is intact to light touch and symmetrical to face.  VII: Smile is symmetrical.  VIII: hearing intact to voice. IX, X: Phonation is normal.  XII: tongue is midline without fasciculations. Motor: 5/5 strength to all muscle groups tested.  Sensation- Intact to light touch bilaterally. Patient states that left side feels "drier" with light touch feeling lighter on left.  She splits the midline to touch sensation and claims sensation is 25% less on the left body.  Sensation intact to vibration but diminished on left.  Patient states that sensation changes exactly at midline on forehead. Extinction absent to light touch to DSS.  Coordination: FTN intact bilaterally, HKS: no ataxia in BLE.No drift.  Gait- deferred   ASSESSMENT/PLAN Ms. CARLITA WHITCOMB is a 65 y.o. female with history of HTN, obesity, schizophrenia and bipolar disorder presenting with two episodes of left-sided weakness, shaking and slurred speech which resolved spontaneously.  These episodes were associated with auditory hallucinations, which are common for patient.  Patient denies ever having seizure activity.  Current symptoms are not reflective of a stroke.  Given negative CT and MRI, previous symptoms may have  been caused by a TIA vs. other neurological or psychiatric event.  Transient left body jerking episodes of unclear etiology.  Doubt TIA or stroke:  CT head No acute abnormality.  MRI  No acute abnormality MRA  moderate right P1 stenosis 2D Echo pending Cervical spine CT: Degenerative changes with no cause for symptoms identified LDL 109 HgbA1c 5.8 VTE prophylaxis - lovenox    Diet   Diet regular Room service appropriate? Yes; Fluid consistency: Thin   No antithrombotic prior to admission, now on No antithrombotic.  Therapy recommendations:  outpatient PT Disposition:  to home  Hypertension Home meds:  amlodipine 2.5 mg daily, losartan 50 mg daily, continued in hospital Stable Long-term BP goal  normotensive  Hyperlipidemia Home meds:  atorvastatin 20 mg daily, increase to 40 mg daily LDL 109, goal < 70 High intensity statin initiated Continue statin at discharge   Other Stroke Risk Factors Advanced Age >/= 53  Obesity, Body mass index is 43.05 kg/m., BMI >/= 30 associated with increased stroke risk, recommend weight loss, diet and exercise as appropriate   Other Active Problems Schizophrenia/bipolar disorder Continue home hydroxyzine, nortriptyline and lamotrigine  Hospital day # 0  Cortney E Ernestina Columbia , MSN, AGACNP-BC [facility] See Amion for schedule and pager information 12/16/2020 1:50 PM   STROKE MD NOTE : I have personally obtained history,examined this patient, reviewed notes, independently viewed imaging studies, participated in medical decision making and plan of care.ROS completed by me personally and pertinent positives fully documented  I have made any additions or clarifications directly to the above note. Agree with note above.  Patient gives variable history initially left-sided numbness but today she told me she came in mostly for left-sided involuntary jerking which lasted for 4hours.  She denies any history of headaches or migraines or prior TIAs or strokes.  Her neurological exam is essentially nonfocal except for subjective left hemibody mild diminished sensation with splitting of the midline and nonorganic features.  MRI scan and MR angiogram unremarkable.  She has underlying history of psychiatric disorder and is on medications and reports increasing recent hallucinations.  Do not believe further stroke related work-up is necessary.  Recommend optimal management of underlying psychiatric conditions and follow-up with a psychiatrist.  Discussed with Dr. Cyndie Chime.  Greater than 50% time during this 35-minute visit was spent in counseling and coordination of care and discussion about differential diagnosis about her involuntary movements and  subjective sensory loss with nonorganic features and discussion of brain imaging findings and answering questions.  Stroke team will sign off.  Kindly call for questions.  Delia Heady, MD Medical Director [facility] Pager: [phone] 12/16/2020 2:00 PM   To contact Stroke Continuity provider, please refer to [email or web address]. After hours, contact General Neurology

## 2020-12-16 NOTE — TOC Transition Note (Signed)
Transition of Care [facility]) - CM/SW Discharge Note   Patient Details  Name: Teresa Green MRN: [id number] Date of Birth: 1955/09/23  Transition of [facility] CM/SW Contact:  Kermit Balo, RN Phone Number: 12/16/2020, 12:32 PM   Clinical Narrative:    Patient is from home with her grandson. She denies issues with her home medications.  Patient uses medicaid transport for her medical appointments.  Recommendations are for outpatient therapy. She is refusing this at this time.  Pt states she will have transport home when discharged.    Final next level of care: Home/Self Care Barriers to Discharge: No Barriers Identified   Patient Goals and CMS Choice     Choice offered to / list presented to : Patient  Discharge Placement                       Discharge Plan and Services                                     Social Determinants of Health (SDOH) Interventions     Readmission Risk Interventions No flowsheet data found.

## 2020-12-16 NOTE — Discharge Instructions (Signed)
Dear Teresa Green,  It was a pleasure to take care of you. You were hospitalized for a transient ischemic attack (TIA), also known as a mini stroke. I am glad you are feeling better and are building up your strength!  Please change how you take your lipitor for your cholesterol. Previously, you were taking 20 mg. You will now take 40 mg everyday, to get your cholesterol under better control. Please also start taking aspirin and plavix, to reduce the risk of future strokes. You will take both of these medicines everyday for 21 days, and then stop taking aspirin. You will take plavix everyday indefinitely.  Also continue to take your amlodipine and losartan for your blood pressure. You should follow up with your primary care physician in 1 week to discuss this hospitalization and your medications.  If you have any questions or concerns, call our clinic at [phone] or after hours call [phone] and ask for the internal medicine resident on call.

## 2020-12-16 NOTE — Hospital Course (Addendum)
Teresa Green is a 65 y.o. with a pertinent PMH of HTN, obesity, schizophrenia, and bipolar disorder, who presented with left sided weakness and admitted for  possible TIA.   Possible transient ischemic attack vs psych component Her episode of left-sided weakness and change in sensation are suggestive of a TIA episode.  CT head and MRI brain were normal in the ED. MRA of head and neck were also unremarkable, aside from only a moderate stenosis of right P1. The MRA results are not consistent the severity of her symptoms. Cervical spine x-ray also did not reveal any fracture or neuroforaminal impingement. HIV negative. TSH within normal limits. A1c 5.8 and LDL 109. PT/OT evaluated this patient and recommend no OT follow up and outpatient PT for this patient.  No need to discharge with DAPT per neuro recs, as TIA is not likely. Recommend close follow up with PCP and psychiatry.   Hypertension Her home meds for hypertension include amlodipine 2.5 mg, telmisartan 40 mg, and losartan 50 mg, although the patient is not sure which medications she has or has not been taking. Resumed amlodipine 2.5 mg, as to avoid dropping BP too quickly in the setting of TIA. Discharged back on amlodipine and losartan, will discontinue telmisartan to avoid 2 ARB medications. Recommend follow up with PCP.   Schizophrenia Bipolar disorder Anxiety Continued patient's home nortriptyline, lamotrigine, and hydroxyzine. Patient also had Abilify listed on her medication list, however, she notes that this was discontinued by her provider.

## 2020-12-16 NOTE — Evaluation (Signed)
Physical Therapy Evaluation and Discharge Patient Details Name: Teresa Green MRN: [id number] DOB: 08/25/1955 Today's Date: 12/16/2020  History of Present Illness  Pt is a 65 y/o female who presents 12/15/2020 with reported L sided weakness, L side N/T, pain in the L UE/LE and L low back, and slurred speech/difficulty swallowing. Per chart review, resolution of symptoms at 10AM day of admission. PMH significant for schizophrenia, bipolar disorder, hypertension.   Clinical Impression  Patient evaluated by Physical Therapy with no further acute PT needs identified. All education has been completed and the patient has no further questions. At the time of PT eval pt was able to perform bed mobility and transfers independently with up to light supervision for hallway ambulation. Pt is likely at/near baseline of function, however could benefit from outpatient PT follow up for higher level balance training. Acutely, pt does not require further skilled PT services. See below for any follow-up Physical Therapy or equipment needs. PT is signing off. Thank you for this referral.        Recommendations for follow up therapy are one component of a multi-disciplinary discharge planning process, led by the attending physician.  Recommendations may be updated based on patient status, additional functional criteria and insurance authorization.  Follow Up Recommendations Outpatient PT    Assistance Recommended at Discharge PRN  Functional Status Assessment Patient has had a recent decline in their functional status and demonstrates the ability to make significant improvements in function in a reasonable and predictable amount of time.  Equipment Recommendations  None recommended by PT    Recommendations for Other Services       Precautions / Restrictions Precautions Precautions: Fall Precaution Comments: Pt reports a fall to her L side PTA, unclear when. Restrictions Weight Bearing Restrictions: No       Mobility  Bed Mobility Overal bed mobility: Independent             General bed mobility comments: HOB flat, no use of rails to simulate home environment.    Transfers Overall transfer level: Independent Equipment used: None               General transfer comment: No assist required to power-up to full stand. Pt without unsteadiness or LOB noted.    Ambulation/Gait Ambulation/Gait assistance: Supervision Gait Distance (Feet): 400 Feet Assistive device: None Gait Pattern/deviations: Step-through pattern;Decreased stride length;Drifts right/left Gait velocity: Decreased Gait velocity interpretation: 1.31 - 2.62 ft/sec, indicative of limited community ambulator   General Gait Details: Pt mildly drifting to the R and coming very close to objects in the hall on the right side. She is able to avoid running into these obstacles on the R except for 1 time and brushed the object, did not overtly run into it. Light supervision provided throughout gait training.  Stairs            Wheelchair Mobility    Modified Rankin (Stroke Patients Only) Modified Rankin (Stroke Patients Only) Pre-Morbid Rankin Score: No significant disability Modified Rankin: Moderately severe disability     Balance Overall balance assessment: Mild deficits observed, not formally tested                                           Pertinent Vitals/Pain Pain Assessment: Faces Faces Pain Scale: Hurts little more Pain Location: L lower back and L hip Pain Descriptors / Indicators: Aching;Discomfort Pain  Intervention(s): Limited activity within patient's tolerance;Monitored during session;Repositioned    Home Living Family/patient expects to be discharged to:: Private residence Living Arrangements: Other relatives (grandson) Available Help at Discharge: Family;Available PRN/intermittently ("He's there most of the time") Type of Home: House Home Access: Stairs to enter    [employer] of Steps: 1   Home Layout: One level        Prior Function Prior Level of Function : Independent/Modified Independent             Mobility Comments: Does not use an AD PTA. ADLs Comments: Does not drive but states she is able to get her groceries when grandson drives her to the store. States independent with bathing/dressing  PTA.     Hand Dominance        Extremity/Trunk Assessment   Upper Extremity Assessment Upper Extremity Assessment: Defer to OT evaluation    Lower Extremity Assessment Lower Extremity Assessment: RLE deficits/detail;LLE deficits/detail RLE Deficits / Details: Decreased strength in R quads, hip flexors (4/5). Pt denies N/T in RLE. Hamstrings and ankle DF 5/5. LLE Deficits / Details: Decreased strength in L hamstrings (4/5) but 5/5 in quads, hip flexors, ankle DF. Pt denies N/T in LLE.    Cervical / Trunk Assessment Cervical / Trunk Assessment: Normal;Other exceptions Cervical / Trunk Exceptions: Pt reports new pain in L low back into hip  Communication   Communication: No difficulties  Cognition Arousal/Alertness: Awake/alert Behavior During Therapy: Flat affect Overall Cognitive Status: Impaired/Different from baseline Area of Impairment: Memory                     Memory: Decreased short-term memory         General Comments: Difficulty recalling details of home set up at times. Appears mildly confused at times but grossly oriented. Could benefit from further cognitive evaluation.        General Comments      Exercises     Assessment/Plan    PT Assessment All further PT needs can be met in the next venue of care  PT Problem List         PT Treatment Interventions      PT Goals (Current goals can be found in the Care Plan section)  Acute Rehab PT Goals Patient Stated Goal: Eat breakfast PT Goal Formulation: All assessment and education complete, DC therapy    Frequency     Barriers to  discharge        Co-evaluation               AM-PAC PT "6 Clicks" Mobility  Outcome Measure Help needed turning from your back to your side while in a flat bed without using bedrails?: None Help needed moving from lying on your back to sitting on the side of a flat bed without using bedrails?: None Help needed moving to and from a bed to a chair (including a wheelchair)?: None Help needed standing up from a chair using your arms (e.g., wheelchair or bedside chair)?: None Help needed to walk in hospital room?: A Little Help needed climbing 3-5 steps with a railing? : A Little 6 Click Score: 22    End of Session   Activity Tolerance: Patient tolerated treatment well Patient left: in chair;with call bell/phone within reach Nurse Communication: Mobility status PT Visit Diagnosis: Unsteadiness on feet (R26.81);Other abnormalities of gait and mobility (R26.89);Muscle weakness (generalized) (M62.81);History of falling (Z91.81);Other symptoms and signs involving the nervous system (R29.898)  Time: 8469-6295 PT Time Calculation (min) (ACUTE ONLY): 17 min   Charges:   PT Evaluation $PT Eval Moderate Complexity: 1 Mod          Rolinda Roan, PT, DPT Acute Rehabilitation Services Pager: [phone] Office: [phone]   Thelma Comp 12/16/2020, 9:33 AM

## 2020-12-16 NOTE — Progress Notes (Signed)
   HD#1 SUBJECTIVE:  Patient Summary: Teresa Green is a 65 y.o. with a pertinent PMH of HTN, obesity, schizophrenia, and bipolar disorder, who presented with left sided weakness and admitted for TIA.   Overnight Events: No acute events overnight  Interim History: This is hospital day 1 for Teresa Green who was seen and evaluated at the bedside this morning. She feels much improved this morning and has no acute complaints. Denies any one sided weakness or numbness and tingling.   OBJECTIVE:  Vital Signs: Vitals:   12/15/20 1700 12/15/20 1853 12/16/20 0113 12/16/20 0355  BP: (!) 159/63 (!) 155/61 (!) 136/58 (!) 143/62  Pulse: (!) 58 63 61 68  Resp:   16 16  Temp:  98.5 F (36.9 C) 98.2 F (36.8 C) 98.2 F (36.8 C)  TempSrc:  Oral Oral Oral  SpO2: 97% 100% 95% 97%  Weight:      Height:       Supplemental O2: Room Air SpO2: 97 %  Filed Weights   12/15/20 0446  Weight: 110.2 kg    No intake or output data in the 24 hours ending 12/16/20 0607 Net IO Since Admission: No IO data has been entered for this period [12/16/20 0607]  Physical Exam: General: No acute distress. Head: Normocephalic. Atraumatic. CV: RRR. No murmurs, rubs, or gallops. No LE edema Pulmonary: Lungs CTAB. Normal effort. No wheezing or rales. Abdominal: Soft, nontender, nondistended. Normal bowel sounds. Extremities: Palpable radial and DP pulses. Normal ROM. Skin: Warm and dry. No obvious rash or lesions. Neuro: A&Ox3. Moves all extremities. Normal sensation. 5/5 strength bilateral upper and lower extremities Psych: Normal mood and affect    ASSESSMENT/PLAN:  Assessment: Principal Problem:   TIA (transient ischemic attack)   Plan: #Transient ischemic attack CT head and MRI brain were unremarkable. Patient underwent MRA head and neck, and revealed a moderate P1 stenosis, however, not consistent with the severity of her initial symptoms. Today, patient's symptoms are improved and she denies any 1  sided weakness or numbness/tingling. LDL elevated at 109 and HbA1c 5.8.  - Continue DAPT x 21 days, plavix therapy alone after - Increase lipitor to 40 mg daily - Echo to be performed prior to discharge - Outpatient PT recommended  #Hypertension Her home meds for hypertension include amlodipine, telmisartan and losartan.  Unsure of which ones she is taking, however, resumed amlodipine yesterday.  - Continue amlodipine 2.5 mg daily - Restarted losartan 50 mg daily  #Schizophrenia #Bipolar disorder #Anxiety Continued on home medications, including nortriptyline, lamotrigine, and hydroxyzine.   Best Practice: Diet: Regular diet IVF: Fluids: none VTE: enoxaparin (LOVENOX) injection 40 mg Start: 12/15/20 1600 Code: Full AB: None Therapy Recs:  outpatient PT , DME: none DISPO: Anticipated discharge today or tomorrow to Home pending  Echo .  Signature: Elza Rafter, D.O.  Internal Medicine Resident, PGY-1 [facility]  Pager: [phone] 6:07 AM, 12/16/2020   Please contact the on call pager after 5 pm and on weekends at [phone].

## 2020-12-16 NOTE — Evaluation (Signed)
Occupational Therapy Evaluation Patient Details Name: Teresa Green MRN: [id number] DOB: 04/29/1955 Today's Date: 12/16/2020   History of Present Illness Pt is a 65 y/o female who presents 12/15/2020 with reported L sided weakness, L side N/T, pain in the L UE/LE and L low back, and slurred speech/difficulty swallowing. Per chart review, resolution of symptoms at 10AM day of admission. PMH significant for schizophrenia, bipolar disorder, hypertension.   Clinical Impression   Pt admitted for concerns listed above. PTA pt reported that she was independent with all ADL's and most IADL's. Pt's grandson lives with her and provides assist with whatever she needs. At this time, functionally, pt appears near her baseline, generally weak in BUE, but able to complete ADL's independently. Functionally, pt appears to have some vision deficits, especially on the R side, as she runs into items when ambulating. With vision assessment, pt is very slow to respond, had difficulty following commands to not turn her head for tasks, and at times appear confused with what was being asked of her. OT recommended pt follow up with an eye doctor once discharged, otherwise, pt has no further OT needs and acute OT will sign off.       Recommendations for follow up therapy are one component of a multi-disciplinary discharge planning process, led by the attending physician.  Recommendations may be updated based on patient status, additional functional criteria and insurance authorization.   Follow Up Recommendations  No OT follow up    Assistance Recommended at Discharge PRN  Functional Status Assessment  Patient has had a recent decline in their functional status and demonstrates the ability to make significant improvements in function in a reasonable and predictable amount of time.  Equipment Recommendations  None recommended by OT    Recommendations for Other Services       Precautions / Restrictions  Precautions Precautions: Fall Precaution Comments: Pt reports a fall to her L side PTA, unclear when. Restrictions Weight Bearing Restrictions: No      Mobility Bed Mobility Overal bed mobility: Independent             General bed mobility comments: HOB flat, no use of rails to simulate home environment.    Transfers Overall transfer level: Independent Equipment used: None               General transfer comment: No assist required to power-up to full stand. Pt without unsteadiness or LOB noted.      Balance Overall balance assessment: Mild deficits observed, not formally tested                                         ADL either performed or assessed with clinical judgement   ADL Overall ADL's : At baseline;Modified independent                                       General ADL Comments: Pt able to complete ADLs with increased time.     Vision Baseline Vision/History: 0 No visual deficits Ability to See in Adequate Light: 0 Adequate Patient Visual Report: No change from baseline Vision Assessment?: Yes Eye Alignment: Within Functional Limits Ocular Range of Motion: Within Functional Limits Alignment/Gaze Preference: Within Defined Limits Tracking/Visual Pursuits: Requires cues, head turns, or add eye shifts to track Saccades: Additional  head turns occurred during testing Convergence: Within functional limits Visual Fields: Impaired-to be further tested in functional context Additional Comments: Unsure accuracy of visual assessment due to pt cognition/slow to respond. Functionally, pt appears to run into/almost run into items on her R side, when asked she reports that she sees them just fine.     Perception     Praxis      Pertinent Vitals/Pain Pain Assessment: Faces Faces Pain Scale: Hurts little more Pain Location: L lower back and L hip Pain Descriptors / Indicators: Aching;Discomfort Pain Intervention(s): Monitored  during session;Repositioned     Hand Dominance Right   Extremity/Trunk Assessment Upper Extremity Assessment Upper Extremity Assessment: Generalized weakness   Lower Extremity Assessment Lower Extremity Assessment: Defer to PT evaluation RLE Deficits / Details: Decreased strength in R quads, hip flexors (4/5). Pt denies N/T in RLE. Hamstrings and ankle DF 5/5. LLE Deficits / Details: Decreased strength in L hamstrings (4/5) but 5/5 in quads, hip flexors, ankle DF. Pt denies N/T in LLE.   Cervical / Trunk Assessment Cervical / Trunk Assessment: Normal;Other exceptions Cervical / Trunk Exceptions: Pt reports new pain in L low back into hip   Communication Communication Communication: No difficulties   Cognition Arousal/Alertness: Awake/alert Behavior During Therapy: Flat affect Overall Cognitive Status: Impaired/Different from baseline Area of Impairment: Memory                     Memory: Decreased short-term memory         General Comments: Difficulty recalling details of home set up at times. Appears mildly confused at times but grossly oriented.     General Comments  VSS on RA    Exercises     Shoulder Instructions      Home Living Family/patient expects to be discharged to:: Private residence Living Arrangements: Other relatives (grandson) Available Help at Discharge: Family;Available PRN/intermittently ("He's there most of the time") Type of Home: House Home Access: Stairs to enter [employer] of Steps: 1   Home Layout: One level     Bathroom Shower/Tub: Chief Strategy Officer: Standard     Home Equipment: None          Prior Functioning/Environment Prior Level of Function : Independent/Modified Independent             Mobility Comments: Does not use an AD PTA. ADLs Comments: Does not drive but states she is able to get her groceries when grandson drives her to the store. States independent with bathing/dressing   PTA.        OT Problem List: Decreased strength;Decreased activity tolerance;Impaired vision/perception;Decreased coordination;Decreased cognition;Decreased safety awareness      OT Treatment/Interventions:      OT Goals(Current goals can be found in the care plan section) Acute Rehab OT Goals Patient Stated Goal: None stated OT Goal Formulation: All assessment and education complete, DC therapy Time For Goal Achievement: 12/16/20 Potential to Achieve Goals: Good  OT Frequency:     Barriers to D/C:            Co-evaluation              AM-PAC OT "6 Clicks" Daily Activity     Outcome Measure Help from another person eating meals?: None Help from another person taking care of personal grooming?: None Help from another person toileting, which includes using toliet, bedpan, or urinal?: None Help from another person bathing (including washing, rinsing, drying)?: None Help from another person to  put on and taking off regular upper body clothing?: None Help from another person to put on and taking off regular lower body clothing?: None 6 Click Score: 24   End of Session Nurse Communication: Mobility status  Activity Tolerance: Patient tolerated treatment well Patient left: in bed;with call bell/phone within reach  OT Visit Diagnosis: Unsteadiness on feet (R26.81);Other abnormalities of gait and mobility (R26.89);Muscle weakness (generalized) (M62.81)                Time: 4158-3094 OT Time Calculation (min): 11 min Charges:  OT General Charges $OT Visit: 1 Visit OT Evaluation $OT Eval Low Complexity: 1 Low  Christos Mixson H., OTR/L Acute Rehabilitation  Rosealyn Little Elane Bing Plume 12/16/2020, 10:53 AM

## 2020-12-16 NOTE — Care Management Obs Status (Signed)
MEDICARE OBSERVATION STATUS NOTIFICATION   Patient Details  Name: Teresa Green MRN: [id number] Date of Birth: 1955-04-06   Medicare Observation Status Notification Given:  Yes    Kermit Balo, RN 12/16/2020, 12:31 PM

## 2020-12-16 NOTE — Discharge Summary (Addendum)
Name: Teresa Green MRN: [id number] DOB: 09/13/1955 65 y.o. PCP: Cipriano Mile, NP  Date of Admission: 12/15/2020 12:26 AM Date of Discharge:  12/16/2020 Attending Physician: Dr. Angelia Mould  DISCHARGE DIAGNOSIS:  Primary Problem: TIA (transient ischemic attack)   Hospital Problems: Principal Problem:   TIA (transient ischemic attack)    DISCHARGE MEDICATIONS:   Allergies as of 12/16/2020   No Known Allergies      Medication List     TAKE these medications    amLODipine 2.5 MG tablet Commonly known as: NORVASC Take 2.5 mg by mouth daily.   ARIPiprazole 10 MG tablet Commonly known as: ABILIFY Take 10 mg by mouth daily.   atorvastatin 40 MG tablet Commonly known as: LIPITOR Take 1 tablet (40 mg total) by mouth daily. What changed:  medication strength how much to take   cetirizine 10 MG tablet Commonly known as: ZYRTEC Take 10 mg by mouth daily as needed for allergies.   diclofenac Sodium 1 % Gel Commonly known as: VOLTAREN Apply 1 application topically 4 (four) times daily as needed (hip pain).   fluticasone 50 MCG/ACT nasal spray Commonly known as: FLONASE Place 1 spray into both nostrils daily as needed for allergies.   hydrOXYzine 25 MG tablet Commonly known as: ATARAX/VISTARIL Take 25 mg by mouth 3 (three) times daily.   lamoTRIgine 25 MG tablet Commonly known as: LAMICTAL Take 50 mg by mouth daily.   losartan 50 MG tablet Commonly known as: COZAAR Take 50 mg by mouth daily.   Myrbetriq 25 MG Tb24 tablet Generic drug: mirabegron ER Take 25 mg by mouth at bedtime.   nortriptyline 25 MG capsule Commonly known as: PAMELOR Take 25 mg by mouth at bedtime.   VITAMIN D3 PO Take 1 tablet by mouth daily.        DISPOSITION AND FOLLOW-UP:  Teresa Green was discharged from [facility] in Stable condition. At the hospital follow up visit please address:  Follow-up Recommendations: Consults: Neurology Labs: Basic  Metabolic Profile, Hgb [id number], and LDL Cholesterol Studies: None Medications:  Lipitor 40 mg daily Continue amlodipine 2.5 mg daily and losartan 50 mg daily. STOP telmisartan.   Recommend outpatient neurology follow up on discharge  Follow-up Appointments:  Follow-up Information     Cipriano Mile, NP. Schedule an appointment as soon as possible for a visit in 1 week(s).   Why: hospital follow up Contact information: [place] [address] [phone]                 HOSPITAL COURSE:  Patient Summary: Teresa Green is a 65 y.o. with a pertinent PMH of HTN, obesity, schizophrenia, and bipolar disorder, who presented with left sided weakness and admitted for  possible TIA.   Possible transient ischemic attack vs psych component Her episode of left-sided weakness and change in sensation are suggestive of a TIA episode.  CT head and MRI brain were normal in the ED. MRA of head and neck were also unremarkable, aside from only a moderate stenosis of right P1. The MRA results are not consistent the severity of her symptoms. Cervical spine x-ray also did not reveal any fracture or neuroforaminal impingement. HIV negative. TSH within normal limits. A1c 5.8 and LDL 109. PT/OT evaluated this patient and recommend no OT follow up and outpatient PT for this patient.  No need to discharge with DAPT per neuro recs, as TIA is not likely. Recommend close follow up with PCP and psychiatry.  Hypertension Her home meds for hypertension include amlodipine 2.5 mg, telmisartan 40 mg, and losartan 50 mg, although the patient is not sure which medications she has or has not been taking. Resumed amlodipine 2.5 mg, as to avoid dropping BP too quickly in the setting of TIA. Discharged back on amlodipine and losartan, will discontinue telmisartan to avoid 2 ARB medications. Recommend follow up with PCP.   Schizophrenia Bipolar disorder Anxiety Continued patient's home nortriptyline,  lamotrigine, and hydroxyzine. Patient also had Abilify listed on her medication list, however, she notes that this was discontinued by her provider.     DISCHARGE INSTRUCTIONS:   Discharge Instructions     Ambulatory referral to Physical Therapy   Complete by: As directed    Call MD for:  difficulty breathing, headache or visual disturbances   Complete by: As directed    Call MD for:  persistant dizziness or light-headedness   Complete by: As directed    Diet - low sodium heart healthy   Complete by: As directed    Diet general   Complete by: As directed    Increase activity slowly   Complete by: As directed    Increase activity slowly   Complete by: As directed       Dear Ms. Twaddle,  It was a pleasure to take care of you. You were hospitalized for a transient ischemic attack (TIA), also known as a mini stroke. I am glad you are feeling better and are building up your strength!  Please change how you take your lipitor for your cholesterol. Previously, you were taking 20 mg. You will now take 40 mg everyday, to get your cholesterol under better control. Please also start taking aspirin and plavix, to reduce the risk of future strokes. You will take both of these medicines everyday for 21 days, and then stop taking aspirin. You will take plavix everyday indefinitely.  Also continue to take your amlodipine and losartan for your blood pressure. You should follow up with your primary care physician in 1 week to discuss this hospitalization and your medications.  If you have any questions or concerns, call our clinic at [phone] or after hours call [phone] and ask for the internal medicine resident on call.   SUBJECTIVE:  Teresa Green was seen and evaluated on the day of discharge. She overall feels much better compared to when she came in. She has no acute complaints today.   Discharge Vitals:   BP (!) 124/54   Pulse 67   Temp 98 F (36.7 C) (Oral)   Resp 17   Ht 5'  3" (1.6 m)   Wt 110.2 kg   SpO2 100%   BMI 43.05 kg/m   OBJECTIVE:  General: No acute distress. CV: RRR. No murmurs, rubs, or gallops. No LE edema Pulmonary: Lungs CTAB. Normal effort. Abdominal: Soft, nontender, nondistended. Normal bowel sounds. Extremities: Palpable radial and DP pulses. Normal ROM. Skin: Warm and dry. No obvious rash or lesions. Neuro: A&Ox3. Moves all extremities. Normal sensation. 5/5 strength bilateral upper and lower extremities.  Psych: Normal mood and affect    Pertinent Labs, Studies, and Procedures:  CBC Latest Ref Rng & Units 12/16/2020 12/15/2020 12/15/2020  WBC 4.0 - 10.5 K/uL 7.3 7.2 -  Hemoglobin 12.0 - 15.0 g/dL 12.7 12.3 11.2(L)  Hematocrit 36.0 - 46.0 % 39.2 39.0 33.0(L)  Platelets 150 - 400 K/uL 176 166 -    CMP Latest Ref Rng & Units 12/16/2020 12/15/2020 12/15/2020  Glucose 70 -  99 mg/dL 237(S) 91 283(T)  BUN 8 - 23 mg/dL 14 13 19   Creatinine 0.44 - 1.00 mg/dL ) 5.17(O) 1.60(V)  Sodium 135 - 145 mmol/L 139 141 138  Potassium 3.5 - 5.1 mmol/L 3.8 4.0 5.5(H)  Chloride 98 - 111 mmol/L 109 109 113(H)  CO2 22 - 32 mmol/L 25 27 -  Calcium 8.9 - 10.3 mg/dL 9.7 9.9 -  Total Protein 6.5 - 8.1 g/dL - - -  Total Bilirubin 0.3 - 1.2 mg/dL - - -  Alkaline Phos 38 - 126 U/L - - -  AST 15 - 41 U/L - - -  ALT 0 - 44 U/L - - -    DG Cervical Spine Complete  Result Date: 12/15/2020 CLINICAL DATA:  Patient woke up from a nap with pins and needles in the left side of body. Neuro intact bilaterally. EXAM: CERVICAL SPINE - COMPLETE 4+ VIEW COMPARISON:  None. FINDINGS: Multilevel degenerative changes most marked at C5-6 and C6-7. No malalignment or fracture. The pre odontoid space and prevertebral soft tissues are normal. The neural foramina are grossly patent. No other abnormalities. IMPRESSION: Degenerative changes. No other cause for the patient's symptoms identified. Electronically Signed   By: 12/17/2020 III M.D.   On: 12/15/2020 14:16    CT HEAD WO CONTRAST  Result Date: 12/15/2020 CLINICAL DATA:  Acute stroke suspected. Numbness and tingling on the left side of body since last night EXAM: CT HEAD WITHOUT CONTRAST TECHNIQUE: Contiguous axial images were obtained from the base of the skull through the vertex without intravenous contrast. COMPARISON:  07/05/2020 FINDINGS: Brain: No evidence of acute infarction, hemorrhage, hydrocephalus, extra-axial collection or mass lesion/mass effect. Vascular: No hyperdense vessel or unexpected calcification. Skull: Normal. Negative for fracture or focal lesion. Sinuses/Orbits: No acute finding. IMPRESSION: Negative head CT. Electronically Signed   By: 07/07/2020 M.D.   On: 12/15/2020 06:20   MR ANGIO HEAD WO CONTRAST  Result Date: 12/15/2020 CLINICAL DATA:  Neuro deficit, acute, stroke suspected. 2 episodes of left-sided numbness and weakness. EXAM: MRA HEAD WITHOUT CONTRAST TECHNIQUE: Angiographic images of the Circle of Willis were acquired using MRA technique without intravenous contrast. COMPARISON:  No pertinent prior exam. FINDINGS: Anterior circulation: The internal carotid arteries are widely patent from skull base to carotid termini. ACAs and MCAs are patent without evidence of a proximal branch occlusion or significant proximal stenosis. Assessment of the right MCA bifurcation is mildly limited by motion artifact. No aneurysm is identified. Posterior circulation: The included distal vertebral arteries are widely patent to the basilar and codominant. Patent PICA and SCA origins are seen bilaterally. The basilar artery is widely patent. There is a moderately large right posterior communicating artery. Both PCAs are patent without evidence of a significant proximal stenosis on the left. There is a moderate right P1 stenosis. No aneurysm is identified. Anatomic variants: None. IMPRESSION: 1. No major intracranial arterial occlusion. 2. Moderate right P1 stenosis. Electronically Signed   By:  12/17/2020 M.D.   On: 12/15/2020 15:35   MR ANGIO NECK WO CONTRAST  Result Date: 12/15/2020 CLINICAL DATA:  Neuro deficit, acute, stroke suspected. 2 episodes of left-sided numbness and weakness. EXAM: MRA NECK WITHOUT CONTRAST TECHNIQUE: Angiographic images of the neck were acquired using MRA technique without intravenous contrast. Carotid stenosis measurements (when applicable) are obtained utilizing NASCET criteria, using the distal internal carotid diameter as the denominator. COMPARISON:  No pertinent prior exam. FINDINGS: Assessment is limited by noncontrast technique and mild motion artifact.  Aortic arch: Not imaged on this noncontrast study. Right carotid system: Patent without evidence of significant stenosis or dissection. Left carotid system: Patent without evidence of significant stenosis or dissection. Vertebral arteries: The vertebral arteries are patent and codominant with antegrade flow bilaterally and no evidence of a significant stenosis or dissection although assessment of the proximal V1 segments is limited by motion, and the origin of the left vertebral artery was not imaged. IMPRESSION: Negative noncontrast neck MRA. Electronically Signed   By: Logan Bores M.D.   On: 12/15/2020 15:38   MR BRAIN WO CONTRAST  Result Date: 12/15/2020 CLINICAL DATA:  Numbness and tingling. Altered mental status. Difficulty moving left arm and left leg EXAM: MRI HEAD WITHOUT CONTRAST TECHNIQUE: Multiplanar, multiecho pulse sequences of the brain and surrounding structures were obtained without intravenous contrast. COMPARISON:  Head CT from earlier the same day FINDINGS: Brain: No acute infarction, hemorrhage, hydrocephalus, extra-axial collection or mass lesion. Minimal periventricular FLAIR hyperintensity, usually microvascular ischemic. Brain volume is normal. No chronic blood products. Vascular: Normal flow voids Skull and upper cervical spine: Normal marrow signal Sinuses/Orbits: Partial right  mastoid opacification. Negative nasopharynx. IMPRESSION: Unremarkable brain MRI for age. Electronically Signed   By: Jorje Guild M.D.   On: 12/15/2020 10:56     Signed: Buddy Duty, D.O.  Internal Medicine Resident, PGY-1 [facility]  Pager: [phone] 1:42 PM, 12/16/2020

## 2020-12-20 ENCOUNTER — Other Ambulatory Visit: Payer: Self-pay | Admitting: Student

## 2020-12-20 DIAGNOSIS — Z780 Asymptomatic menopausal state: Secondary | ICD-10-CM

## 2021-01-02 ENCOUNTER — Other Ambulatory Visit: Payer: Self-pay | Admitting: Internal Medicine

## 2021-01-20 ENCOUNTER — Other Ambulatory Visit: Payer: Self-pay | Admitting: Internal Medicine

## 2022-02-16 ENCOUNTER — Other Ambulatory Visit: Payer: Self-pay | Admitting: Student

## 2022-02-16 DIAGNOSIS — Z1231 Encounter for screening mammogram for malignant neoplasm of breast: Secondary | ICD-10-CM

## 2022-02-16 DIAGNOSIS — E2839 Other primary ovarian failure: Secondary | ICD-10-CM

## 2022-04-16 ENCOUNTER — Ambulatory Visit: Payer: 59

## 2022-06-21 ENCOUNTER — Other Ambulatory Visit: Payer: Self-pay | Admitting: Student

## 2022-06-21 DIAGNOSIS — G44209 Tension-type headache, unspecified, not intractable: Secondary | ICD-10-CM

## 2022-07-30 ENCOUNTER — Other Ambulatory Visit: Payer: 59

## 2022-08-12 ENCOUNTER — Ambulatory Visit (INDEPENDENT_AMBULATORY_CARE_PROVIDER_SITE_OTHER): Payer: 59

## 2022-08-12 ENCOUNTER — Ambulatory Visit (HOSPITAL_COMMUNITY)
Admission: EM | Admit: 2022-08-12 | Discharge: 2022-08-12 | Disposition: A | Discharge status: Home / Self Care | Payer: 59 | Attending: Family Medicine | Admitting: Family Medicine

## 2022-08-12 ENCOUNTER — Encounter (HOSPITAL_COMMUNITY): Payer: Self-pay | Admitting: Family Medicine

## 2022-08-12 DIAGNOSIS — M25562 Pain in left knee: Secondary | ICD-10-CM

## 2022-08-12 MED ORDER — OXYCODONE HCL 5 MG PO TABS: 5.0000 mg | ORAL_TABLET | Freq: Three times a day (TID) | ORAL | 0 refills | Status: AC | PRN

## 2022-08-12 NOTE — ED Provider Notes (Signed)
[facility]    CSN: [id number] Arrival date & time: 08/12/22  1701      History   Chief Complaint Chief Complaint  Patient presents with   Knee Pain   Leg Pain    HPI Teresa Green is a 67 y.o. female.   The history is provided by the patient. No language interpreter was used.  Knee Pain Location:  Knee (Left knee pain x 5 days) Knee location:  L knee Pain details:    Quality:  Aching   Radiates to: Radiates down the left shin.   Timing:  Constant   Progression:  Worsening Chronicity:  New Prior injury to area:  No Relieved by:  Acetaminophen and NSAIDs Worsened by:  Bearing weight Associated symptoms: no decreased ROM, no stiffness and no swelling   Leg Pain Associated symptoms: no decreased ROM, no stiffness and no swelling   Hypertension This is a chronic problem. Treatments tried: She is compliant with her Norvasc 2.5 mg QD and Losartan 50 mg QD.    History reviewed. No pertinent past medical history.  Patient Active Problem List   Diagnosis Date Noted   TIA (transient ischemic attack) 12/15/2020   Acute psychosis (HCC)     History reviewed. No pertinent surgical history.  OB History   No obstetric history on file.      Home Medications    Prior to Admission medications   Medication Sig Start Date End Date Taking? Authorizing Provider  oxyCODONE (OXY IR/ROXICODONE) 5 MG immediate release tablet Take 1 tablet (5 mg total) by mouth every 8 (eight) hours as needed for up to 7 days for severe pain. 08/12/22 08/19/22 Yes Janit Pagan T, MD  amLODipine (NORVASC) 2.5 MG tablet Take 2.5 mg by mouth daily. 07/23/20   [provider]  ARIPiprazole (ABILIFY) 10 MG tablet Take 10 mg by mouth daily. 09/05/20   [provider]  atorvastatin (LIPITOR) 40 MG tablet Take 1 tablet (40 mg total) by mouth daily. 12/16/20   Atway, Rayann N, DO  cetirizine (ZYRTEC) 10 MG tablet Take 10 mg by mouth daily as needed for allergies. 06/28/16    [provider]  Cholecalciferol (VITAMIN D3 PO) Take 1 tablet by mouth daily.    [provider]  diclofenac Sodium (VOLTAREN) 1 % GEL Apply 1 application topically 4 (four) times daily as needed (hip pain). 11/21/20   [provider]  fluticasone (FLONASE) 50 MCG/ACT nasal spray Place 1 spray into both nostrils daily as needed for allergies. 05/25/20   [provider]  hydrOXYzine (ATARAX/VISTARIL) 25 MG tablet Take 25 mg by mouth 3 (three) times daily. 11/21/20   [provider]  lamoTRIgine (LAMICTAL) 25 MG tablet Take 50 mg by mouth daily. 09/05/20   [provider]  losartan (COZAAR) 50 MG tablet Take 50 mg by mouth daily. 09/05/20   [provider]  MYRBETRIQ 25 MG TB24 tablet Take 25 mg by mouth at bedtime. 07/23/20   [provider]  nortriptyline (PAMELOR) 25 MG capsule Take 25 mg by mouth at bedtime. 11/21/20   [provider]    Family History History reviewed. No pertinent family history.  Social History Social History   Tobacco Use   Smoking status: Never   Smokeless tobacco: Never     Allergies   Patient has no known allergies.   Review of Systems Review of Systems  Musculoskeletal:  Negative for stiffness.  All other systems reviewed and are negative.  Physical Exam Triage Vital Signs ED Triage Vitals  Encounter Vitals Group     BP      Systolic BP Percentile      Diastolic BP Percentile      Pulse      Resp      Temp      Temp src      SpO2      Weight      Height      Head Circumference      Peak Flow      Pain Score      Pain Loc      Pain Education      Exclude from Growth Chart    No data found.  Updated Vital Signs BP (!) 158/80 (BP Location: Left Arm)   Pulse (!) 101   Temp 98.6 F (37 C) (Oral)   Resp 20   SpO2 97%   Visual Acuity Right Eye Distance:   Left Eye Distance:   Bilateral Distance:    Right Eye Near:   Left Eye Near:    Bilateral  Near:     Physical Exam Vitals and nursing note reviewed.  Cardiovascular:     Rate and Rhythm: Normal rate and regular rhythm.     Heart sounds: Normal heart sounds. No murmur heard. Pulmonary:     Effort: Pulmonary effort is normal.     Breath sounds: Normal breath sounds. No wheezing or rhonchi.  Musculoskeletal:     Right knee: Normal.     Left knee: No swelling, deformity, effusion or erythema. Normal range of motion.     Comments: Mild suprapatellar tenderness      UC Treatments / Results  Labs (all labs ordered are listed, but only abnormal results are displayed) Labs Reviewed - No data to display  EKG   Radiology No results found.  Procedures Procedures (including critical care time)  Medications Ordered in UC Medications - No data to display  Initial Impression / Assessment and Plan / UC Course  I have reviewed the triage vital signs and the nursing notes.  Pertinent labs & imaging results that were available during my care of the patient were reviewed by me and considered in my medical decision making (see chart for details).     Acute left knee pain: ?? OA vs Gout (less likely due to the site and lack of swelling or erythema) I discussed conservative measures given the acuity. However, she insisted on obtaining Knee xray Xray result pending - I will contact her with her test results Continue Ibuprofen or Tylenol as needed for pain Oxycodone 5 mg prn breakthrough pain F/U with PCP for PT referral if symptoms persists  HTN: Repeat BP of 158/84 Elevated BP likely related to her pain Continue home BP meds and follow up with PCP soon.   Final Clinical Impressions(s) / UC Diagnoses   Final diagnoses:  Acute pain of left knee     Discharge Instructions      It was nice seeing you today. Your knee pain may be due to new onset osteoarthritis, less likely gout since there is no swelling or redness. Your Knee xray is pending. However, I will  contact you soon with your test results.  In the meantime, continue Tylenol or Ibuprofen as needed for pain.  I prescribed Oxycodone prn breakthrough pain.      ED Prescriptions     Medication Sig Dispense Auth. Provider   oxyCODONE (OXY IR/ROXICODONE) 5 MG  immediate release tablet Take 1 tablet (5 mg total) by mouth every 8 (eight) hours as needed for up to 7 days for severe pain. 15 tablet Doreene Eland, MD      I have reviewed the PDMP during this encounter.   Doreene Eland, MD 08/12/22 [phone]

## 2022-08-12 NOTE — Discharge Instructions (Addendum)
It was nice seeing you today. Your knee pain may be due to new onset osteoarthritis, less likely gout since there is no swelling or redness. Your Knee xray is pending. However, I will contact you soon with your test results.  In the meantime, continue Tylenol or Ibuprofen as needed for pain.  I prescribed Oxycodone prn breakthrough pain.

## 2022-08-12 NOTE — ED Triage Notes (Signed)
Pt reports that having left knee pain and left lower leg pain for several days. Denies fall or injury.

## 2022-08-13 ENCOUNTER — Telehealth: Payer: Self-pay | Admitting: Family Medicine

## 2022-08-13 NOTE — Telephone Encounter (Signed)
Patient did not pick up her phone and no option to leave a message.  NB: Xray knee is normal.

## 2022-08-14 ENCOUNTER — Emergency Department (HOSPITAL_COMMUNITY)
Admission: EM | Admit: 2022-08-14 | Discharge: 2022-08-14 | Disposition: A | Discharge status: Home / Self Care | Payer: 59 | Attending: Emergency Medicine | Admitting: Emergency Medicine

## 2022-08-14 ENCOUNTER — Other Ambulatory Visit: Payer: Self-pay

## 2022-08-14 DIAGNOSIS — M25562 Pain in left knee: Secondary | ICD-10-CM | POA: Diagnosis not present

## 2022-08-14 MED ORDER — ETODOLAC 400 MG PO TABS: 400.0000 mg | ORAL_TABLET | Freq: Two times a day (BID) | ORAL | 0 refills | Status: AC

## 2022-08-14 NOTE — ED Notes (Signed)
Pt is a&ox4, arm and dry to touch. Pt complains of 10/10 pain to left knee and lower leg. Pt denies any injury to site. Pt covered with blanket, side rails up x 2. Pt is next to RN station should she need anything.

## 2022-08-14 NOTE — Discharge Instructions (Signed)
Your exam was overall reassuring.  Recent x-ray did not show any concerning findings.  You got a shot of Toradol in the emergency department.  Lodine prescribed to you.  Pick up your pain medication you are prescribed a couple days ago from urgent care.  Follow-up with the orthopedist.  For any concerning symptoms return to the emergency room.

## 2022-08-14 NOTE — ED Provider Notes (Signed)
[facility] EMERGENCY DEPARTMENT AT [facility] Provider Note   CSN: [id number] Arrival date & time: 08/14/22  1050     History  Chief Complaint  Patient presents with   Knee Pain    Pt coming from home with left knee pain x 4 days. No injury to site, no swelling and no deformity noted or reported. Seen at [facility] yesterday for same.     Teresa Green is a 67 y.o. female.  67 year old female presents today for evaluation of left knee pain ongoing for about 5 days.  Seen at urgent care and was given pain medication.  She has not been able to pick this up.  Denies any injury to the left knee.  Denies any swelling, fever.  She has used diclofenac gel without any significant improvement.  The history is provided by the patient. No language interpreter was used.       Home Medications Prior to Admission medications   Medication Sig Start Date End Date Taking? Authorizing Provider  amLODipine (NORVASC) 2.5 MG tablet Take 2.5 mg by mouth daily. 07/23/20   [provider]  ARIPiprazole (ABILIFY) 10 MG tablet Take 10 mg by mouth daily. 09/05/20   [provider]  atorvastatin (LIPITOR) 40 MG tablet Take 1 tablet (40 mg total) by mouth daily. 12/16/20   Atway, Rayann N, DO  cetirizine (ZYRTEC) 10 MG tablet Take 10 mg by mouth daily as needed for allergies. 06/28/16   [provider]  Cholecalciferol (VITAMIN D3 PO) Take 1 tablet by mouth daily.    [provider]  diclofenac Sodium (VOLTAREN) 1 % GEL Apply 1 application topically 4 (four) times daily as needed (hip pain). 11/21/20   [provider]  fluticasone (FLONASE) 50 MCG/ACT nasal spray Place 1 spray into both nostrils daily as needed for allergies. 05/25/20   [provider]  hydrOXYzine (ATARAX/VISTARIL) 25 MG tablet Take 25 mg by mouth 3 (three) times daily. 11/21/20   [provider]  lamoTRIgine (LAMICTAL) 25 MG tablet Take 50 mg by mouth daily. 09/05/20   [provider]  losartan (COZAAR) 50 MG tablet Take 50 mg by mouth daily. 09/05/20   [provider]  MYRBETRIQ 25 MG TB24 tablet Take 25 mg by mouth at bedtime. 07/23/20   [provider]  nortriptyline (PAMELOR) 25 MG capsule Take 25 mg by mouth at bedtime. 11/21/20   [provider]  oxyCODONE (OXY IR/ROXICODONE) 5 MG immediate release tablet Take 1 tablet (5 mg total) by mouth every 8 (eight) hours as needed for up to 7 days for severe pain. 08/12/22 08/19/22  Doreene Eland, MD      Allergies    Patient has no known allergies.    Review of Systems   Review of Systems  Constitutional:  Negative for fever.  Musculoskeletal:  Positive for arthralgias. Negative for gait problem.  All other systems reviewed and are negative.   Physical Exam Updated Vital Signs BP 135/73 (BP Location: Right Arm)   Pulse 80   Temp 98 F (36.7 C) (Oral)   Resp 18   Ht 5\' 3"  (1.6 m)   Wt 119.3 kg   SpO2 97%   BMI 46.59 kg/m  Physical Exam Vitals and nursing note reviewed.  Constitutional:      General: She is not in acute distress.    Appearance: Normal appearance. She is not ill-appearing.  HENT:     Head: Normocephalic and atraumatic.  Nose: Nose normal.  Eyes:     Conjunctiva/sclera: Conjunctivae normal.  Pulmonary:     Effort: Pulmonary effort is normal. No respiratory distress.  Musculoskeletal:        General: No deformity. Normal range of motion.     Comments: Good range of motion in the left lower extremity including the left knee.  Without tenderness to palpation.  Neurovascularly intact.  Skin:    Findings: No rash.  Neurological:     Mental Status: She is alert.     ED Results / Procedures / Treatments   Labs (all labs ordered are listed, but only abnormal results are displayed) Labs Reviewed - No data to display  EKG None  Radiology DG Knee Complete 4 Views Left  Result Date: 08/12/2022 CLINICAL DATA:  Acute left knee pain EXAM: LEFT  KNEE - COMPLETE 4+ VIEW COMPARISON:  None Available. FINDINGS: No evidence of fracture, dislocation, or joint effusion. No evidence of arthropathy or other focal bone abnormality. Soft tissues are unremarkable. IMPRESSION: Negative. Electronically Signed   By: Darliss Cheney M.D.   On: 08/12/2022 18:31    Procedures Procedures    Medications Ordered in ED Medications - No data to display  ED Course/ Medical Decision Making/ A&P                             Medical Decision Making  67 year old female presents for evaluation of left knee pain for 5 days.  Recently seen at urgent care.  X-ray was done at the urgent care.  Did not show any acute findings, or joint swelling.  She has good range of motion.  Exam overall reassuring.  Will provide patient with a shot of Toradol.  Short course of Lodine.  Recent renal function showed creatinine 1.15.  Orthopedic referral given.  Patient is appropriate for discharge.  Discharged in stable condition.  Return precautions discussed.   Final Clinical Impression(s) / ED Diagnoses Final diagnoses:  Left knee pain, unspecified chronicity    Rx / DC Orders ED Discharge Orders          Ordered    etodolac (LODINE) 400 MG tablet  2 times daily        08/14/22 1230              Marita Kansas, PA-C 08/14/22 1231    Arby Barrette, MD 08/17/22 [id number]

## 2022-08-14 NOTE — ED Notes (Signed)
Tried to call son @ [phone]. Unable to leave a vm due to vm not being set up.

## 2022-08-29 ENCOUNTER — Ambulatory Visit: Admission: RE | Admit: 2022-08-29 | Payer: 59 | Source: Ambulatory Visit

## 2022-08-29 DIAGNOSIS — G44209 Tension-type headache, unspecified, not intractable: Secondary | ICD-10-CM

## 2022-08-29 MED ORDER — GADOPICLENOL 0.5 MMOL/ML IV SOLN: 10.0000 mL | Freq: Once | INTRAVENOUS | Status: DC | PRN

## 2023-02-05 ENCOUNTER — Other Ambulatory Visit: Payer: Self-pay

## 2023-02-05 ENCOUNTER — Emergency Department (HOSPITAL_COMMUNITY)
Admission: EM | Admit: 2023-02-05 | Discharge: 2023-02-06 | Disposition: A | Discharge status: Home / Self Care | Payer: 59 | Attending: Emergency Medicine | Admitting: Emergency Medicine

## 2023-02-05 ENCOUNTER — Encounter (HOSPITAL_COMMUNITY): Payer: Self-pay | Admitting: Emergency Medicine

## 2023-02-05 DIAGNOSIS — R350 Frequency of micturition: Secondary | ICD-10-CM | POA: Diagnosis present

## 2023-02-05 DIAGNOSIS — N3000 Acute cystitis without hematuria: Secondary | ICD-10-CM | POA: Diagnosis not present

## 2023-02-05 DIAGNOSIS — R519 Headache, unspecified: Secondary | ICD-10-CM | POA: Insufficient documentation

## 2023-02-05 DIAGNOSIS — M791 Myalgia, unspecified site: Secondary | ICD-10-CM | POA: Insufficient documentation

## 2023-02-05 DIAGNOSIS — Z20822 Contact with and (suspected) exposure to covid-19: Secondary | ICD-10-CM | POA: Diagnosis not present

## 2023-02-05 DIAGNOSIS — R0981 Nasal congestion: Secondary | ICD-10-CM | POA: Insufficient documentation

## 2023-02-05 DIAGNOSIS — Z79899 Other long term (current) drug therapy: Secondary | ICD-10-CM | POA: Diagnosis not present

## 2023-02-05 DIAGNOSIS — I10 Essential (primary) hypertension: Secondary | ICD-10-CM | POA: Insufficient documentation

## 2023-02-05 DIAGNOSIS — R410 Disorientation, unspecified: Secondary | ICD-10-CM | POA: Diagnosis not present

## 2023-02-05 LAB — RESP PANEL BY RT-PCR (RSV, FLU A&B, COVID)  RVPGX2
Influenza A by PCR: NEGATIVE
Influenza B by PCR: NEGATIVE
Resp Syncytial Virus by PCR: NEGATIVE
SARS Coronavirus 2 by RT PCR: NEGATIVE

## 2023-02-05 NOTE — ED Triage Notes (Signed)
 Patient complaining of headache, vaginal burning and odor, generalized body aches, and congestion. Patient states these symptoms have been ongoing for months. Denies sick contacts.

## 2023-02-06 ENCOUNTER — Emergency Department (HOSPITAL_COMMUNITY): Payer: 59

## 2023-02-06 DIAGNOSIS — N3000 Acute cystitis without hematuria: Secondary | ICD-10-CM | POA: Diagnosis not present

## 2023-02-06 LAB — WET PREP, GENITAL
Clue Cells Wet Prep HPF POC: NONE SEEN
Clue Cells Wet Prep HPF POC: NONE SEEN
Sperm: NONE SEEN
Sperm: NONE SEEN
Trich, Wet Prep: NONE SEEN
Trich, Wet Prep: NONE SEEN
WBC, Wet Prep HPF POC: >=10 — AB (ref <10)
WBC, Wet Prep HPF POC: >=10 — AB (ref <10)
Yeast Wet Prep HPF POC: NONE SEEN
Yeast Wet Prep HPF POC: NONE SEEN

## 2023-02-06 LAB — URINALYSIS, ROUTINE W REFLEX MICROSCOPIC
Bilirubin Urine: NEGATIVE
Glucose, UA: NEGATIVE mg/dL
Ketones, ur: NEGATIVE mg/dL
Nitrite: NEGATIVE
Protein, ur: NEGATIVE mg/dL
Specific Gravity, Urine: 1.005 (ref 1.005–1.030)
pH: 6 (ref 5.0–8.0)

## 2023-02-06 MED ORDER — NITROFURANTOIN MONOHYD MACRO 100 MG PO CAPS
100.0000 mg | ORAL_CAPSULE | Freq: Once | ORAL | Status: AC
  Administered 2023-02-06: 100 mg via ORAL
  Filled 2023-02-06 (×2): qty 1

## 2023-02-06 MED ORDER — NITROFURANTOIN MONOHYD MACRO 100 MG PO CAPS: 100.0000 mg | ORAL_CAPSULE | Freq: Two times a day (BID) | ORAL | 0 refills | Status: AC

## 2023-02-06 NOTE — ED Provider Triage Note (Signed)
Emergency Medicine Provider Triage Evaluation Note  Teresa Green , a 68 y.o. female  was evaluated in triage.  Pt complains of months of burning in her vaginal and rectal region and breast tenderness and ongoing headaches for several months.  Patient states she cannot get into see her PCP until the 27th..  Review of Systems  Positive: Vaginal burning Negative:   Physical Exam  BP (!) 165/71   Pulse 71   Temp 97.7 F (36.5 C)   Resp 18   Ht 5\' 3"  (1.6 m)   Wt 114.8 kg   SpO2 99%   BMI 44.82 kg/m  Gen:   Awake, no distress   Resp:  Normal effort  MSK:   Moves extremities without difficulty  Other:  Patient wants to make sure she does not a sexually transmitted illness although she has not had any intercourse for the past 7 years  Medical Decision Making  Medically screening exam initiated at 1:19 PM.  Appropriate orders placed.  Teresa Green was informed that the remainder of the evaluation will be completed by another provider, this initial triage assessment does not replace that evaluation, and the importance of remaining in the ED until their evaluation is complete.     Arthor Captain, PA-C 02/06/23 1321
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
## 2023-02-06 NOTE — Discharge Instructions (Addendum)
 Your flu, COVID, RSV is negative.  You are negative for trichomonas, BV, and yeast.  Your urine shows that you likely have a urinary tract infection.  You have been prescribed nitrofurantoin  (Macrobid ). Take this antibiotic 2 times a day for the next 5 days. Take the full course of your antibiotic even if you start feeling better. Antibiotics may cause you to have diarrhea.  You are given your first dose of antibiotic here today.  Your urine has been sent off for culture.  If your antibiotic needs to be changed, you will be contacted.  Return the ER for any severe abdominal pain, severe back pain, nausea or vomiting, fevers, any other new or concerning symptoms.

## 2023-02-08 LAB — URINE CULTURE

## 2023-03-21 ENCOUNTER — Other Ambulatory Visit: Payer: Self-pay | Admitting: Student

## 2023-03-21 DIAGNOSIS — E2839 Other primary ovarian failure: Secondary | ICD-10-CM
